# Patient Record
Sex: Male | Born: 1995
Health system: Southern US, Community
[De-identification: ages and names within clinical notes are randomized; demographics above are authoritative.]

## PROBLEM LIST (undated history)

## (undated) DIAGNOSIS — T189XXA Foreign body of alimentary tract, part unspecified, initial encounter: Secondary | ICD-10-CM

## (undated) DIAGNOSIS — F909 Attention-deficit hyperactivity disorder, unspecified type: Secondary | ICD-10-CM

## (undated) DIAGNOSIS — T182XXA Foreign body in stomach, initial encounter: Secondary | ICD-10-CM

## (undated) DIAGNOSIS — R636 Underweight: Secondary | ICD-10-CM

## (undated) DIAGNOSIS — F419 Anxiety disorder, unspecified: Secondary | ICD-10-CM

## (undated) HISTORY — PX: WISDOM TOOTH EXTRACTION: SHX21

## (undated) HISTORY — DX: Underweight: R63.6

## (undated) HISTORY — DX: Attention-deficit hyperactivity disorder, unspecified type: F90.9

## (undated) HISTORY — DX: Anxiety disorder, unspecified: F41.9

## (undated) HISTORY — DX: Foreign body of alimentary tract, part unspecified, initial encounter: T18.9XXA

## (undated) HISTORY — DX: Foreign body in stomach, initial encounter: T18.2XXA

---
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
## 2015-01-20 ENCOUNTER — Encounter: Payer: Self-pay | Admitting: Family Medicine

## 2015-01-20 ENCOUNTER — Ambulatory Visit (INDEPENDENT_AMBULATORY_CARE_PROVIDER_SITE_OTHER): Payer: BLUE CROSS/BLUE SHIELD | Admitting: Family Medicine

## 2015-01-20 VITALS — BP 98/61 | HR 66 | Temp 97.8°F | Ht 75.7 in | Wt 144.0 lb

## 2015-01-20 DIAGNOSIS — F988 Other specified behavioral and emotional disorders with onset usually occurring in childhood and adolescence: Secondary | ICD-10-CM

## 2015-01-20 DIAGNOSIS — F909 Attention-deficit hyperactivity disorder, unspecified type: Secondary | ICD-10-CM | POA: Diagnosis not present

## 2015-01-20 MED ORDER — METHYLPHENIDATE HCL 20 MG PO TABS: ORAL_TABLET | ORAL | Status: DC

## 2015-01-20 MED ORDER — METHYLPHENIDATE HCL ER (LA) 40 MG PO CP24: 40.0000 mg | ORAL_CAPSULE | ORAL | Status: DC

## 2015-01-20 MED ORDER — GUANFACINE HCL ER 3 MG PO TB24: 1.0000 | ORAL_TABLET | Freq: Every day | ORAL | Status: DC

## 2015-01-20 NOTE — Assessment & Plan Note (Signed)
Under good control on current regimen. Continue current regimen. Continue to monitor. 3 month supply given on his medicine. Recheck at physical in 3 months.

## 2015-01-20 NOTE — Progress Notes (Signed)
BP 98/61 mmHg  Pulse 66  Temp(Src) 97.8 F (36.6 C)  Ht 6' 3.7" (1.923 m)  Wt 144 lb (65.318 kg)  BMI 17.66 kg/m2  SpO2 98%   Subjective:    Patient ID: Kevin Townsend, male    DOB: [birth year], 19 y.o.   MRN: [id number]  HPI: Kevin Townsend is a 19 y.o. male  Chief Complaint  Patient presents with  . ADHD   ADHD FOLLOW UP ADHD status: controlled Satisfied with current therapy: yes Medication compliance:  excellent compliance Controlled substance contract: yes Previous psychiatry evaluation: no Previous medications: yes    Taking meds on weekends/vacations: occasionally Work/school performance:  good Difficulty sustaining attention/completing tasks: no Distracted by extraneous stimuli: no Does not listen when spoken to: no  Fidgets with hands or feet: no Unable to stay in seat: no Blurts out/interrupts others: no ADHD Medication Side Effects: no    Decreased appetite: yes    Headache: no    Sleeping disturbance pattern: no    Irritability: no    Rebound effects (worse than baseline) off medication: no    Anxiousness: no    Dizziness: no    Tics: no  Relevant past medical, surgical, family and social history reviewed and updated as indicated. Interim medical history since our last visit reviewed. Allergies and medications reviewed and updated.  Review of Systems  Constitutional: Negative.   Respiratory: Negative.   Cardiovascular: Negative.   Musculoskeletal: Negative.   Psychiatric/Behavioral: Negative.     Per HPI unless specifically indicated above     Objective:    BP 98/61 mmHg  Pulse 66  Temp(Src) 97.8 F (36.6 C)  Ht 6' 3.7" (1.923 m)  Wt 144 lb (65.318 kg)  BMI 17.66 kg/m2  SpO2 98%  Wt Readings from Last 3 Encounters:  01/20/15 144 lb (65.318 kg) (34 %*, Z = -0.41)  10/26/14 138 lb (62.596 kg) (26 %*, Z = -0.66)  07/26/14 134 lb 6.4 oz (60.963 kg) (21 %*, Z = -0.80)   * Growth percentiles are based on CDC 2-20 Years data.    Physical Exam   Constitutional: He is oriented to person, place, and time. He appears well-developed and well-nourished. No distress.  HENT:  Head: Normocephalic and atraumatic.  Right Ear: Hearing normal.  Left Ear: Hearing normal.  Nose: Nose normal.  Eyes: Conjunctivae and lids are normal. Right eye exhibits no discharge. Left eye exhibits no discharge. No scleral icterus.  Pulmonary/Chest: Effort normal. No respiratory distress.  Musculoskeletal: Normal range of motion.  Neurological: He is alert and oriented to person, place, and time.  Skin: Skin is intact. No rash noted.  Psychiatric: He has a normal mood and affect. His speech is normal and behavior is normal. Judgment and thought content normal. Cognition and memory are normal.    Results for orders placed or performed in visit on 08/10/14  CBC with Differential  Result Value Ref Range   WBC 4.7 3.4 - 10.8 x10E3/uL   RBC 4.93 4.14 - 5.80 x10E6/uL   Hemoglobin 12.8 12.6 - 17.7 g/dL   Hematocrit 86.5 78.4 - 51.0 %   MCV 79 79 - 97 fL   MCH 26.0 (L) 26.6 - 33.0 pg   MCHC 33.0 31.5 - 35.7 g/dL   RDW 69.6 (H) 29.5 - 28.4 %   Platelets 221 150 - 379 x10E3/uL   Neutrophils 56 %   Lymphs 35 %   Monocytes 9 %   Eos 0 %   Basos 0 %  Neutrophils Absolute 2.6 1.4 - 7.0 x10E3/uL   Lymphocytes Absolute 1.6 0.7 - 3.1 x10E3/uL   Monocytes Absolute 0.4 0.1 - 0.9 x10E3/uL   EOS (ABSOLUTE) 0.0 0.0 - 0.4 x10E3/uL   Basophils Absolute 0.0 0.0 - 0.2 x10E3/uL   Immature Granulocytes 0 %   Immature Grans (Abs) 0.0 0.0 - 0.1 x10E3/uL  Ferritin  Result Value Ref Range   Ferritin 12 (L) 16 - 124 ng/mL  B12  Result Value Ref Range   Vitamin B-12 572 211 - 946 pg/mL  Folate  Result Value Ref Range   Folate 7.6 >3.0 ng/mL  Iron and TIBC  Result Value Ref Range   Total Iron Binding Capacity 380 250 - 450 ug/dL   UIBC 865359 (H) 784111 - 696343 ug/dL   Iron 21 (L) 38 - 295169 ug/dL   Iron Saturation 6 (LL) 15 - 55 %      Assessment & Plan:   Problem List  Items Addressed This Visit      Other   ADD (attention deficit disorder) - Primary    Under good control on current regimen. Continue current regimen. Continue to monitor. 3 month supply given on his medicine. Recheck at physical in 3 months.           Follow up plan: Return in about 3 months (around 04/20/2015) for Physical.

## 2015-01-25 ENCOUNTER — Ambulatory Visit: Payer: BLUE CROSS/BLUE SHIELD | Admitting: Family Medicine

## 2015-04-20 ENCOUNTER — Ambulatory Visit: Payer: BLUE CROSS/BLUE SHIELD | Admitting: Family Medicine

## 2015-05-02 ENCOUNTER — Encounter: Payer: Self-pay | Admitting: Unknown Physician Specialty

## 2015-05-02 ENCOUNTER — Ambulatory Visit (INDEPENDENT_AMBULATORY_CARE_PROVIDER_SITE_OTHER): Payer: BLUE CROSS/BLUE SHIELD | Admitting: Unknown Physician Specialty

## 2015-05-02 VITALS — BP 109/64 | HR 71 | Temp 98.6°F | Ht 75.2 in | Wt 144.2 lb

## 2015-05-02 DIAGNOSIS — J029 Acute pharyngitis, unspecified: Secondary | ICD-10-CM

## 2015-05-02 DIAGNOSIS — J358 Other chronic diseases of tonsils and adenoids: Secondary | ICD-10-CM

## 2015-05-02 MED ORDER — AMOXICILLIN 875 MG PO TABS: 875.0000 mg | ORAL_TABLET | Freq: Two times a day (BID) | ORAL | Status: DC

## 2015-05-02 NOTE — Progress Notes (Signed)
BP 109/64 mmHg  Pulse 71  Temp(Src) 98.6 F (37 C)  Ht 6' 3.2" (1.91 m)  Wt 144 lb 3.2 oz (65.409 kg)  BMI 17.93 kg/m2  SpO2 98%   Subjective:    Patient ID: Kevin Townsend, male    DOB: 05/19/1995, 20 y.o.   MRN: [id number]  HPI: Kevin Townsend is a 20 y.o. male  Chief Complaint  Patient presents with  . Sore Throat    pt states he has had a sore throat for about a week, but has started hurting bad over the past few days   Sore Throat  This is a new problem. The current episode started in the past 7 days. The problem has been rapidly worsening. There has been no fever. The pain is severe. Associated symptoms include neck pain, swollen glands and trouble swallowing. Pertinent negatives include no abdominal pain, coughing or ear pain. He has tried nothing for the symptoms.     Relevant past medical, surgical, family and social history reviewed and updated as indicated. Interim medical history since our last visit reviewed. Allergies and medications reviewed and updated.  Review of Systems  HENT: Positive for trouble swallowing. Negative for ear pain.   Respiratory: Negative for cough.   Gastrointestinal: Negative for abdominal pain.  Musculoskeletal: Positive for neck pain.    Per HPI unless specifically indicated above     Objective:    BP 109/64 mmHg  Pulse 71  Temp(Src) 98.6 F (37 C)  Ht 6' 3.2" (1.91 m)  Wt 144 lb 3.2 oz (65.409 kg)  BMI 17.93 kg/m2  SpO2 98%  Wt Readings from Last 3 Encounters:  05/02/15 144 lb 3.2 oz (65.409 kg) (33 %*, Z = -0.44)  01/20/15 144 lb (65.318 kg) (34 %*, Z = -0.41)  10/26/14 138 lb (62.596 kg) (26 %*, Z = -0.66)   * Growth percentiles are based on CDC 2-20 Years data.    Physical Exam  Constitutional: He is oriented to person, place, and time. He appears well-developed and well-nourished. No distress.  HENT:  Head: Normocephalic and atraumatic.  Right Ear: Tympanic membrane and ear canal normal.  Left Ear: Tympanic  membrane and ear canal normal.  Nose: Right sinus exhibits no maxillary sinus tenderness and no frontal sinus tenderness. Left sinus exhibits no maxillary sinus tenderness and no frontal sinus tenderness.  Mouth/Throat: Uvula is midline. Oropharyngeal exudate, posterior oropharyngeal edema and posterior oropharyngeal erythema present.  Eyes: Conjunctivae and lids are normal. Right eye exhibits no discharge. Left eye exhibits no discharge. No scleral icterus.  Neck: Neck supple.  Cardiovascular: Normal rate, regular rhythm and normal heart sounds.   Pulmonary/Chest: Effort normal and breath sounds normal. No respiratory distress.  Abdominal: Normal appearance. There is no splenomegaly or hepatomegaly.  Musculoskeletal: Normal range of motion.  Neurological: He is alert and oriented to person, place, and time.  Skin: Skin is warm, dry and intact. No rash noted. No pallor.  Psychiatric: He has a normal mood and affect. His behavior is normal. Judgment and thought content normal.  Nursing note and vitals reviewed.   Results for orders placed or performed in visit on 08/10/14  CBC with Differential  Result Value Ref Range   WBC 4.7 3.4 - 10.8 x10E3/uL   RBC 4.93 4.14 - 5.80 x10E6/uL   Hemoglobin 12.8 12.6 - 17.7 g/dL   Hematocrit 91.438.8 78.237.5 - 51.0 %   MCV 79 79 - 97 fL   MCH 26.0 (L) 26.6 - 33.0 pg  MCHC 33.0 31.5 - 35.7 g/dL   RDW 16.1 (H) 09.6 - 04.5 %   Platelets 221 150 - 379 x10E3/uL   Neutrophils 56 %   Lymphs 35 %   Monocytes 9 %   Eos 0 %   Basos 0 %   Neutrophils Absolute 2.6 1.4 - 7.0 x10E3/uL   Lymphocytes Absolute 1.6 0.7 - 3.1 x10E3/uL   Monocytes Absolute 0.4 0.1 - 0.9 x10E3/uL   EOS (ABSOLUTE) 0.0 0.0 - 0.4 x10E3/uL   Basophils Absolute 0.0 0.0 - 0.2 x10E3/uL   Immature Granulocytes 0 %   Immature Grans (Abs) 0.0 0.0 - 0.1 x10E3/uL  Ferritin  Result Value Ref Range   Ferritin 12 (L) 16 - 124 ng/mL  B12  Result Value Ref Range   Vitamin B-12 572 211 - 946 pg/mL   Folate  Result Value Ref Range   Folate 7.6 >3.0 ng/mL  Iron and TIBC  Result Value Ref Range   Total Iron Binding Capacity 380 250 - 450 ug/dL   UIBC 409 (H) 811 - 914 ug/dL   Iron 21 (L) 38 - 782 ug/dL   Iron Saturation 6 (LL) 15 - 55 %      Assessment & Plan:   Problem List Items Addressed This Visit    None    Visit Diagnoses    Sore throat    -  Primary    Relevant Orders    Rapid strep screen (not at [facility])    Mononucleosis screen    Tonsillar exudate           rx for Amoxil.  Stop Mincycline whiile taking .  Check Mono spot  Follow up plan: Return if symptoms worsen or fail to improve.

## 2015-05-03 LAB — MONONUCLEOSIS SCREEN: MONO SCREEN: NEGATIVE

## 2015-05-04 LAB — RAPID STREP SCREEN (MED CTR MEBANE ONLY): Strep Gp A Ag, IA W/Reflex: NEGATIVE

## 2015-05-04 LAB — CULTURE, GROUP A STREP

## 2015-08-19 ENCOUNTER — Encounter: Payer: Self-pay | Admitting: Family Medicine

## 2015-08-19 ENCOUNTER — Ambulatory Visit (INDEPENDENT_AMBULATORY_CARE_PROVIDER_SITE_OTHER): Payer: BLUE CROSS/BLUE SHIELD | Admitting: Family Medicine

## 2015-08-19 VITALS — BP 105/69 | HR 83 | Temp 98.5°F | Ht 75.0 in | Wt 135.0 lb

## 2015-08-19 DIAGNOSIS — Z23 Encounter for immunization: Secondary | ICD-10-CM

## 2015-08-19 DIAGNOSIS — Z0000 Encounter for general adult medical examination without abnormal findings: Secondary | ICD-10-CM

## 2015-08-19 NOTE — Progress Notes (Signed)
BP 105/69 mmHg  Pulse 83  Temp(Src) 98.5 F (36.9 C)  Ht _0  (1.905 m)  Wt 135 lb (61.236 kg)  BMI 16.87 kg/m2  SpO2 98%   Subjective:    Patient ID: Kevin Townsend, male    DOB: 1995-12-04, 20 y.o.   MRN: [id number]  HPI: Kevin Townsend is a 20 y.o. male presenting on 08/19/2015 for comprehensive medical examination. 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  He currently lives with: with parents Interim Problems from his last visit: no  Depression Screen done today and results listed below:  Depression screen [facility] 2/9 08/19/2015  Decreased Interest 0  Down, Depressed, Hopeless 0  PHQ - 2 Score 0    Past Medical History:  Past Medical History  Diagnosis Date  . Underweight   . ADHD (attention deficit hyperactivity disorder)     Surgical History:  Past Surgical History  Procedure Laterality Date  . No past surgeries      Medications:  No current outpatient prescriptions on file prior to visit.   No current facility-administered medications on file prior to visit.    Allergies:  No Known Allergies  Social History:  Social History   Social History  . Marital Status: Single    Spouse Name: N/A  . Number of Children: 0  . Years of Education: N/A   Occupational History  . Student    Social History Main Topics  . Smoking status: Never Smoker   . Smokeless tobacco: Never Used  . Alcohol Use: No  . Drug Use: No  . Sexual Activity: Not on file   Other Topics Concern  . Not on file   Social History Narrative   History  Smoking status  . Never Smoker   Smokeless tobacco  . Never Used   History  Alcohol Use No    Family History:  History reviewed. No pertinent family history.  Past medical history, surgical history, medications, allergies, family history and social history reviewed with patient today and changes made to appropriate areas of the chart.   Review of Systems    Constitutional: Negative.   HENT: Negative.   Eyes: Negative.   Respiratory: Negative.   Cardiovascular: Negative.   Gastrointestinal: Negative.   Genitourinary: Negative.   Musculoskeletal: Negative.   Skin: Negative.   Neurological: Negative.   Endo/Heme/Allergies: Negative.   Psychiatric/Behavioral: Negative.     All other ROS negative except what is listed above and in the HPI.      Objective:    BP 105/69 mmHg  Pulse 83  Temp(Src) 98.5 F (36.9 C)  Ht _1  (1.905 m)  Wt 135 lb (61.236 kg)  BMI 16.87 kg/m2  SpO2 98%  Wt Readings from Last 3 Encounters:  08/19/15 135 lb (61.236 kg) (18 %*, Z = -0.93)  05/02/15 144 lb 3.2 oz (65.409 kg) (33 %*, Z = -0.44)  01/20/15 144 lb (65.318 kg) (34 %*, Z = -0.41)   * Growth percentiles are based on CDC 2-20 Years data.    Physical Exam  Constitutional: He is oriented to person, place, and time. He appears well-developed and well-nourished. No distress.  HENT:  Head: Normocephalic and atraumatic.  Right Ear: Hearing, tympanic membrane, external ear and ear canal normal.  Left Ear: Hearing, tympanic membrane, external ear and ear canal normal.  Nose: Nose normal.  Mouth/Throat: Uvula is midline, oropharynx is clear and moist and mucous membranes are normal.  No oropharyngeal exudate.  Eyes: Conjunctivae, EOM and lids are normal. Pupils are equal, round, and reactive to light. Right eye exhibits no discharge. Left eye exhibits no discharge. No scleral icterus.  Neck: Normal range of motion. Neck supple. No JVD present. No tracheal deviation present. No thyromegaly present.  Cardiovascular: Normal rate, regular rhythm, normal heart sounds and intact distal pulses.  Exam reveals no gallop and no friction rub.   No murmur heard. Pulmonary/Chest: Effort normal and breath sounds normal. No stridor. No respiratory distress. He has no wheezes. He has no rales. He exhibits no tenderness.  Abdominal: Soft. Bowel sounds are normal. He  exhibits no distension and no mass. There is no tenderness. There is no rebound and no guarding. Hernia confirmed negative in the right inguinal area and confirmed negative in the left inguinal area.  Genitourinary: Testes normal and penis normal. Right testis shows no mass, no swelling and no tenderness. Right testis is descended. Cremasteric reflex is not absent on the right side. Left testis shows no mass, no swelling and no tenderness. Left testis is descended. Cremasteric reflex is not absent on the left side. Circumcised. No penile tenderness.  Musculoskeletal: Normal range of motion. He exhibits no edema or tenderness.  Lymphadenopathy:    He has cervical adenopathy (R anterior chain).  Neurological: He is alert and oriented to person, place, and time. He has normal reflexes. He displays normal reflexes. No cranial nerve deficit. He exhibits normal muscle tone. Coordination normal.  Skin: Skin is warm, dry and intact. No rash noted. He is not diaphoretic. No erythema. No pallor.  Psychiatric: He has a normal mood and affect. His speech is normal and behavior is normal. Judgment and thought content normal. Cognition and memory are normal.  Nursing note and vitals reviewed.   Results for orders placed or performed in visit on 05/02/15  Rapid strep screen (not at [facility])  Result Value Ref Range   Strep Gp A Ag, IA W/Reflex Negative Negative  Culture, Group A Strep  Result Value Ref Range   Strep A Culture Comment (A)   Mononucleosis screen  Result Value Ref Range   Mono Screen Negative Negative      Assessment & Plan:   Problem List Items Addressed This Visit    None    Visit Diagnoses    Routine general medical examination at a health care facility    -  Primary    Vaccines for nursing school started to be updated. Hold on screening labs as low risk. Check next year. Continue diet and exercise.     Immunization due        Hep B #3 given today, Hep A #1 given today, return in 6  months for Hep A #2 and meningitis, will need 2nd MMR as well and varicella    Relevant Orders    Hepatitis A vaccine adult IM    Hepatitis B vaccine adult IM    Hepatitis A vaccine adult IM    Meningococcal polysaccharide vaccine subcutaneous       IMMUNIZATIONS:   - Tdap: Tetanus vaccination status reviewed: last tetanus booster within 10 years. - Influenza: Postponed to flu season  PATIENT COUNSELING:    Sexuality: Discussed sexually transmitted diseases, partner selection, use of condoms, avoidance of unintended pregnancy  and contraceptive alternatives.   Advised to avoid cigarette smoking.  I discussed with the patient that most people either abstain from alcohol or drink within safe limits (<=14/week and <=4 drinks/occasion for  males, <=7/weeks and <= 3 drinks/occasion for females) and that the risk for alcohol disorders and other health effects rises proportionally with the number of drinks per week and how often a drinker exceeds daily limits.  Discussed cessation/primary prevention of drug use and availability of treatment for abuse.   Diet: Encouraged to adjust caloric intake to maintain  or achieve ideal body weight, to reduce intake of dietary saturated fat and total fat, to limit sodium intake by avoiding high sodium foods and not adding table salt, and to maintain adequate dietary potassium and calcium preferably from fresh fruits, vegetables, and low-fat dairy products.    stressed the importance of regular exercise  Injury prevention: Discussed safety belts, safety helmets, smoke detector, smoking near bedding or upholstery.   Dental health: Discussed importance of regular tooth brushing, flossing, and dental visits.   Follow up plan: NEXT PREVENTATIVE PHYSICAL DUE IN 1 YEAR. Return 6 months, nurse visit for shots, 1 year physical.

## 2015-08-19 NOTE — Patient Instructions (Signed)
Health Maintenance, Male A healthy lifestyle and preventative care can promote health and wellness.  Maintain regular health, dental, and eye exams.  Eat a healthy diet. Foods like vegetables, fruits, whole grains, low-fat dairy products, and lean protein foods contain the nutrients you need and are low in calories. Decrease your intake of foods high in solid fats, added sugars, and salt. Get information about a proper diet from your health care provider, if necessary.  Regular physical exercise is one of the most important things you can do for your health. Most adults should get at least 150 minutes of moderate-intensity exercise (any activity that increases your heart rate and causes you to sweat) each week. In addition, most adults need muscle-strengthening exercises on 2 or more days a week.   Maintain a healthy weight. The body mass index (BMI) is a screening tool to identify possible weight problems. It provides an estimate of body fat based on height and weight. Your health care provider can find your BMI and can help you achieve or maintain a healthy weight. For males 20 years and older:  A BMI below 18.5 is considered underweight.  A BMI of 18.5 to 24.9 is normal.  A BMI of 25 to 29.9 is considered overweight.  A BMI of 30 and above is considered obese.  Maintain normal blood lipids and cholesterol by exercising and minimizing your intake of saturated fat. Eat a balanced diet with plenty of fruits and vegetables. Blood tests for lipids and cholesterol should begin at age 20 and be repeated every 5 years. If your lipid or cholesterol levels are high, you are over age 50, or you are at high risk for heart disease, you may need your cholesterol levels checked more frequently.Ongoing high lipid and cholesterol levels should be treated with medicines if diet and exercise are not working.  If you smoke, find out from your health care provider how to quit. If you do not use tobacco, do not  start.  Lung cancer screening is recommended for adults aged 55-80 years who are at high risk for developing lung cancer because of a history of smoking. A yearly low-dose CT scan of the lungs is recommended for people who have at least a 30-pack-year history of smoking and are current smokers or have quit within the past 15 years. A pack year of smoking is smoking an average of 1 pack of cigarettes a day for 1 year (for example, a 30-pack-year history of smoking could mean smoking 1 pack a day for 30 years or 2 packs a day for 15 years). Yearly screening should continue until the smoker has stopped smoking for at least 15 years. Yearly screening should be stopped for people who develop a health problem that would prevent them from having lung cancer treatment.  If you choose to drink alcohol, do not have more than 2 drinks per day. One drink is considered to be 12 oz (360 mL) of beer, 5 oz (150 mL) of wine, or 1.5 oz (45 mL) of liquor.  Avoid the use of street drugs. Do not share needles with anyone. Ask for help if you need support or instructions about stopping the use of drugs.  High blood pressure causes heart disease and increases the risk of stroke. High blood pressure is more likely to develop in:  People who have blood pressure in the end of the normal range (100-139/85-89 mm Hg).  People who are overweight or obese.  People who are African American.    If you are 18-39 years of age, have your blood pressure checked every 3-5 years. If you are 40 years of age or older, have your blood pressure checked every year. You should have your blood pressure measured twice--once when you are at a hospital or clinic, and once when you are not at a hospital or clinic. Record the average of the two measurements. To check your blood pressure when you are not at a hospital or clinic, you can use:  An automated blood pressure machine at a pharmacy.  A home blood pressure monitor.  If you are 45-79 years  old, ask your health care provider if you should take aspirin to prevent heart disease.  Diabetes screening involves taking a blood sample to check your fasting blood sugar level. This should be done once every 3 years after age 45 if you are at a normal weight and without risk factors for diabetes. Testing should be considered at a younger age or be carried out more frequently if you are overweight and have at least 1 risk factor for diabetes.  Colorectal cancer can be detected and often prevented. Most routine colorectal cancer screening begins at the age of 50 and continues through age 75. However, your health care provider may recommend screening at an earlier age if you have risk factors for colon cancer. On a yearly basis, your health care provider may provide home test kits to check for hidden blood in the stool. A small camera at the end of a tube may be used to directly examine the colon (sigmoidoscopy or colonoscopy) to detect the earliest forms of colorectal cancer. Talk to your health care provider about this at age 50 when routine screening begins. A direct exam of the colon should be repeated every 5-10 years through age 75, unless early forms of precancerous polyps or small growths are found.  People who are at an increased risk for hepatitis B should be screened for this virus. You are considered at high risk for hepatitis B if:  You were born in a country where hepatitis B occurs often. Talk with your health care provider about which countries are considered high risk.  Your parents were born in a high-risk country and you have not received a shot to protect against hepatitis B (hepatitis B vaccine).  You have HIV or AIDS.  You use needles to inject street drugs.  You live with, or have sex with, someone who has hepatitis B.  You are a man who has sex with other men (MSM).  You get hemodialysis treatment.  You take certain medicines for conditions like cancer, organ  transplantation, and autoimmune conditions.  Hepatitis C blood testing is recommended for all people born from 1945 through 1965 and any individual with known risk factors for hepatitis C.  Healthy men should no longer receive prostate-specific antigen (PSA) blood tests as part of routine cancer screening. Talk to your health care provider about prostate cancer screening.  Testicular cancer screening is not recommended for adolescents or adult males who have no symptoms. Screening includes self-exam, a health care provider exam, and other screening tests. Consult with your health care provider about any symptoms you have or any concerns you have about testicular cancer.  Practice safe sex. Use condoms and avoid high-risk sexual practices to reduce the spread of sexually transmitted infections (STIs).  You should be screened for STIs, including gonorrhea and chlamydia if:  You are sexually active and are younger than 24 years.  You   are older than 24 years, and your health care provider tells you that you are at risk for this type of infection.  Your sexual activity has changed since you were last screened, and you are at an increased risk for chlamydia or gonorrhea. Ask your health care provider if you are at risk.  If you are at risk of being infected with HIV, it is recommended that you take a prescription medicine daily to prevent HIV infection. This is called pre-exposure prophylaxis (PrEP). You are considered at risk if:  You are a man who has sex with other men (MSM).  You are a heterosexual man who is sexually active with multiple partners.  You take drugs by injection.  You are sexually active with a partner who has HIV.  Talk with your health care provider about whether you are at high risk of being infected with HIV. If you choose to begin PrEP, you should first be tested for HIV. You should then be tested every 3 months for as long as you are taking PrEP.  Use sunscreen. Apply  sunscreen liberally and repeatedly throughout the day. You should seek shade when your shadow is shorter than you. Protect yourself by wearing long sleeves, pants, a wide-brimmed hat, and sunglasses year round whenever you are outdoors.  Tell your health care provider of new moles or changes in moles, especially if there is a change in shape or color. Also, tell your health care provider if a mole is larger than the size of a pencil eraser.  A one-time screening for abdominal aortic aneurysm (AAA) and surgical repair of large AAAs by ultrasound is recommended for men aged 65-75 years who are current or former smokers.  Stay current with your vaccines (immunizations).   This information is not intended to replace advice given to you by your health care provider. Make sure you discuss any questions you have with your health care provider.   Document Released: 07/21/2007 Document Revised: 02/12/2014 Document Reviewed: 06/19/2010 Elsevier Interactive Patient Education 2016 Else[employer] Inc[employer] Hepatitis B Vaccine: What You Need to Know 1. Why get vaccinated? Hepatitis B is a serious disease that affects the liver. It is caused by the hepatitis B virus. Hepatitis B can cause mild illness lasting a few weeks, or it can lead to a serious, lifelong illness. Hepatitis B virus infection can be either acute or chronic. Acute hepatitis B virus infection is a short-term illness that occurs within the first 6 months after someone is exposed to the hepatitis B virus. This can lead to:  fever, fatigue, loss of appetite, nausea, and/or vomiting  jaundice (yellow skin or eyes, dark urine, clay-colored bowel movements)  pain in muscles, joints, and stomach Chronic hepatitis B virus infection is a long-term illness that occurs when the hepatitis B virus remains in a person's body. Most people who go on to develop chronic hepatitis B do not have symptoms, but it is still very serious and can lead to:  liver damage  (cirrhosis)  liver cancer  death Chronically-infected people can spread hepatitis B virus to others, even if they do not feel or look sick themselves. Up to 1.4 million people in the [place] may have chronic hepatitis B infection. About 90% of infants who get hepatitis B become chronically infected and about 1 out of 4 of them dies. Hepatitis B is spread when blood, semen, or other body fluid infected with the Hepatitis B virus enters the body of a person who is not infected.  People can become infected with the virus through:  Birth (a baby whose mother is infected can be infected at or after birth)  Sharing items such as razors or toothbrushes with an infected person  Contact with the blood or open sores of an infected person  Sex with an infected partner  Sharing needles, syringes, or other drug-injection equipment  Exposure to blood from needlesticks or other sharp instruments Each year about 2,000 people in the [place] die from hepatitis B-related liver disease. Hepatitis B vaccine can prevent hepatitis B and its consequences, including liver cancer and cirrhosis. 2. Hepatitis B vaccine Hepatitis B vaccine is made from parts of the hepatitis B virus. It cannot cause hepatitis B infection. The vaccine is usually given as 3 or 4 shots over a 62-month period. Infants should get their first dose of hepatitis B vaccine at birth and will usually complete the series at 28 months of age. All children and adolescents younger than 19 years of age who have not yet gotten the vaccine should also be vaccinated. Hepatitis B vaccine is recommended for unvaccinated adults who are at risk for hepatitis B virus infection, including:  People whose sex partners have hepatitis B  Sexually active persons who are not in a long-term monogamous relationship  Persons seeking evaluation or treatment for a sexually transmitted disease  Men who have sexual contact with other men  People who share  needles, syringes, or other drug-injection equipment  People who have household contact with someone infected with the hepatitis B virus  Health care and public safety workers at risk for exposure to blood or body fluids  Residents and staff of facilities for developmentally disabled persons  Persons in correctional facilities  Victims of sexual assault or abuse  Travelers to regions with increased rates of hepatitis B  People with chronic liver disease, kidney disease, HIV infection, or diabetes  Anyone who wants to be protected from hepatitis B There are no known risks to getting hepatitis B vaccine at the same time as other vaccines. 3. Some people should not get this vaccine Tell the person who is giving the vaccine:  If the person getting the vaccine has any severe, life-threatening allergies. If you ever had a life-threatening allergic reaction after a dose of hepatitis B vaccine, or have a severe allergy to any part of this vaccine, you may be advised not to get vaccinated. Ask your health care provider if you want information about vaccine components.  If the person getting the vaccine is not feeling well. If you have a mild illness, such as a cold, you can probably get the vaccine today. If you are moderately or severely ill, you should probably wait until you recover. Your doctor can advise you. 4. Risks of a vaccine reaction With any medicine, including vaccines, there is a chance of side effects. These are usually mild and go away on their own, but serious reactions are also possible. Most people who get hepatitis B vaccine do not have any problems with it. Minor problems following hepatitis B vaccine include:  soreness where the shot was given  temperature of 99.27F or higher If these problems occur, they usually begin soon after the shot and last 1 or 2 days. Your doctor can tell you more about these reactions. Other problems that could happen after this  vaccine:  People sometimes faint after a medical procedure, including vaccination. Sitting or lying down for about 15 minutes can help prevent fainting and injuries caused  by a fall. Tell your provider if you feel dizzy, or have vision changes or ringing in the ears.  Some people get shoulder pain that can be more severe and longer-lasting than the more routine soreness that can follow injections. This happens very rarely.  Any medication can cause a severe allergic reaction. Such reactions from a vaccine are very rare, estimated at about 1 in a million doses, and would happen within a few minutes to a few hours after the vaccination. As with any medicine, there is a very remote chance of a vaccine causing a serious injury or death. The safety of vaccines is always being monitored. For more information, visit: http://floyd.org/ 5. What if there is a serious problem? What should I look for?  Look for anything that concerns you, such as signs of a severe allergic reaction, very high fever, or unusual behavior. Signs of a severe allergic reaction can include hives, swelling of the face and throat, difficulty breathing, a fast heartbeat, dizziness, and weakness. These would start a few minutes to a few hours after the vaccination. What should I do?  If you think it is a severe allergic reaction or other emergency that can't wait, call 9-1-1 or get to the nearest hospital. Otherwise, call your clinic. Afterward, the reaction should be reported to the Vaccine Adverse Event Reporting System (VAERS). Your doctor should file this report, or you can do it yourself through the VAERS web site at www.vaers.LAgents.no, or by calling 1-(623)447-7539. VAERS does not give medical advice. 6. The National Vaccine Injury Compensation Program The [employer] Vaccine Injury Compensation Program (VICP) is a federal program that was created to compensate people who may have been injured by certain vaccines. Persons  who believe they may have been injured by a vaccine can learn about the program and about filing a claim by calling 1-(213)527-5015 or visiting the VICP website at SpiritualWord.at. There is a time limit to file a claim for compensation. 7. How can I learn more?  Ask your healthcare provider. He or she can give you the vaccine package insert or suggest other sources of information.  Call your local or state health department.  Contact the Centers for Disease Control and Prevention (CDC):  Call 319-817-5695 (1-800-CDC-INFO) or  Visit CDC's website at PicCapture.uy CDC Hepatitis B VIS (08/25/2014)   This information is not intended to replace advice given to you by your health care provider. Make sure you discuss any questions you have with your health care provider.   Document Released: 11/16/2005 Document Revised: 10/13/2014 Document Reviewed: 09/11/2014 Elsevier Interactive Patient Education 2016 Elsevier Inc. Hepatitis A Vaccine: What You Need to Know 1. Why get vaccinated? Hepatitis A is a serious liver disease. It is caused by the hepatitis A virus (HAV). HAV is spread from person to person through contact with the feces (stool) of people who are infected, which can easily happen if someone does not wash his or her hands properly. You can also get hepatitis A from food, water, or objects contaminated with HAV. Symptoms of hepatitis A can include:  fever, fatigue, loss of appetite, nausea, vomiting, and/or joint pain  severe stomach pains and diarrhea (mainly in children), or  jaundice (yellow skin or eyes, dark urine, clay-colored bowel movements). These symptoms usually appear 2 to 6 weeks after exposure and usually last less than 2 months, although some people can be ill for as long as 6 months. If you have hepatitis A you may be too ill to work. Children  often do not have symptoms, but most adults do. You can spread HAV without having symptoms. Hepatitis A  can cause liver failure and death, although this is rare and occurs more commonly in persons 20 years of age or older and persons with other liver diseases, such as hepatitis B or C. Hepatitis A vaccine can prevent hepatitis A. Hepatitis A vaccines were recommended in the [place] beginning in 1996. Since then, the number of cases reported each year in the [place] has dropped from around 31,000 cases to fewer than 1,500 cases. 2. Hepatitis A vaccine Hepatitis A vaccine is an inactivated (killed) vaccine. You will need 2 doses for long-lasting protection. These doses should be given at least 6 months apart. Children are routinely vaccinated between their first and second birthdays (7412 through 3823 months of age). Older children and adolescents can get the vaccine after 23 months. Adults who have not been vaccinated previously and want to be protected against hepatitis A can also get the vaccine. You should get hepatitis A vaccine if you:  are traveling to countries where hepatitis A is common,  are a man who has sex with other men,  use illegal drugs,  have a chronic liver disease such as hepatitis B or hepatitis C,  are being treated with clotting-factor concentrates,  work with hepatitis A-infected animals or in a hepatitis A research laboratory, or  expect to have close personal contact with an international adoptee from a country where hepatitis A is common Ask your healthcare provider if you want more information about any of these groups. There are no known risks to getting hepatitis A vaccine at the same time as other vaccines. 3. Some people should not get this vaccine Tell the person who is giving you the vaccine:  If you have any severe, life-threatening allergies. If you ever had a life-threatening allergic reaction after a dose of hepatitis A vaccine, or have a severe allergy to any part of this vaccine, you may be advised not to get vaccinated. Ask your health care provider if  you want information about vaccine components.  If you are not feeling well. If you have a mild illness, such as a cold, you can probably get the vaccine today. If you are moderately or severely ill, you should probably wait until you recover. Your doctor can advise you. 4. Risks of a vaccine reaction With any medicine, including vaccines, there is a chance of side effects. These are usually mild and go away on their own, but serious reactions are also possible. Most people who get hepatitis A vaccine do not have any problems with it. Minor problems following hepatitis A vaccine include:  soreness or redness where the shot was given  low-grade fever  headache  tiredness If these problems occur, they usually begin soon after the shot and last 1 or 2 days. Your doctor can tell you more about these reactions. Other problems that could happen after this vaccine:  People sometimes faint after a medical procedure, including vaccination. Sitting or lying down for about 15 minutes can help prevent fainting, and injuries caused by a fall. Tell your provider if you feel dizzy, or have vision changes or ringing in the ears.  Some people get shoulder pain that can be more severe and longer lasting than the more routine soreness that can follow injections. This happens very rarely.  Any medication can cause a severe allergic reaction. Such reactions from a vaccine are very rare, estimated at about 1  in a million doses, and would happen within a few minutes to a few hours after the vaccination. As with any medicine, there is a very remote chance of a vaccine causing a serious injury or death. The safety of vaccines is always being monitored. For more information, visit: http://floyd.org/ 5. What if there is a serious problem? What should I look for?  Look for anything that concerns you, such as signs of a severe allergic reaction, very high fever, or unusual behavior. Signs of a severe  allergic reaction can include hives, swelling of the face and throat, difficulty breathing, a fast heartbeat, dizziness, and weakness. These would start a few minutes to a few hours after the vaccination. What should I do?  If you think it is a severe allergic reaction or other emergency that can't wait, call 9-1-1 or get to the nearest hospital. Otherwise, call your clinic. Afterward, the reaction should be reported to the Vaccine Adverse Event Reporting System (VAERS). Your doctor should file this report, or you can do it yourself through the VAERS web site at www.vaers.LAgents.no, or by calling 1-325-351-6807. VAERS does not give medical advice. 6. The National Vaccine Injury Compensation Program The [employer] Vaccine Injury Compensation Program (VICP) is a federal program that was created to compensate people who may have been injured by certain vaccines. Persons who believe they may have been injured by a vaccine can learn about the program and about filing a claim by calling 1-878-092-8579 or visiting the VICP website at SpiritualWord.at. There is a time limit to file a claim for compensation. 7. How can I learn more?  Ask your healthcare provider. He or she can give you the vaccine package insert or suggest other sources of information.  Call your local or state health department.  Contact the Centers for Disease Control and Prevention (CDC):  Call (563)546-1765 (1-800-CDC-INFO) or  Visit CDC's website at PicCapture.uy CDC Hepatitis A Vaccine VIS (08/25/2014)   This information is not intended to replace advice given to you by your health care provider. Make sure you discuss any questions you have with your health care provider.   Document Released: 11/16/2005 Document Revised: 10/13/2014 Document Reviewed: 09/11/2014 Elsevier Interactive Patient Education [employer]

## 2015-08-29 ENCOUNTER — Telehealth: Payer: Self-pay | Admitting: Family Medicine

## 2015-08-29 NOTE — Telephone Encounter (Signed)
Patients father not on [facility]

## 2015-08-29 NOTE — Telephone Encounter (Signed)
Pt's father called stated he is following up on pt's appt from the other week. Please call pt's dad ASAP. Thanks.

## 2015-08-29 NOTE — Telephone Encounter (Signed)
Pt's father is calling regarding his ADHD medication, also wants to inform that pt saw a psychiatrist and they gave him a RX for Vyvanse. Please follow up with pt's father ASAP. Thanks.

## 2015-08-29 NOTE — Telephone Encounter (Signed)
Called pt's father to inform, no answer, left message to return call. Thanks.

## 2015-08-29 NOTE — Telephone Encounter (Signed)
Stephanie: Please call patient's father and let him know that unfortunately he is not on patient's DPR so we can not discuss anything with him.

## 2015-09-06 ENCOUNTER — Telehealth: Payer: Self-pay | Admitting: Family Medicine

## 2015-09-06 NOTE — Telephone Encounter (Signed)
Pt's father called would like a call back from Dr. Laural Benes regarding pt's medication. Dad is listed on pt's DPR okay to discuss medical information. Thanks.

## 2015-09-06 NOTE — Telephone Encounter (Signed)
[employer]. Weston Brass saw psychiatry for his ADD and had his medication changed. Not going to continue to see psychiatry and would like [place] to continue his medication. Will come up to get records release for psychiatry and then will get him his 3 month supply of his medicine once we have the records.

## 2015-10-21 ENCOUNTER — Telehealth: Payer: Self-pay | Admitting: Family Medicine

## 2015-10-21 NOTE — Telephone Encounter (Signed)
They are under the media tab when you go into the patient's chart. Thanks

## 2015-10-21 NOTE — Telephone Encounter (Signed)
Spoke with patients father, he wanted to discuss the records from neuropsych and get you to start the patient on I believe Vyvanse and Ritalin, over the phone.  Patients father does not understand why his ADHD was not addressed at his CPE in July, I informed him that his son should have signed a paper that explained with was covered at a physical. Patient's father was told that he would have to come in every 3 months to have his medication written, he states that he has always taken his children to the doctor twice a year once for a CPE/ADHD and [facility]. I explained that you require every three months, he states that we are just trying to make money off of them and that this was not in his budget for him to come back to the doctor again. They he states that he is not going to win with me so he would just call back to schedule an appointment.

## 2015-10-21 NOTE — Telephone Encounter (Signed)
Can you please find out what he needs? Thanks!

## 2015-10-21 NOTE — Telephone Encounter (Signed)
Pt's dad called would like a call back from Dr. Laural BenesJohnson. Records have been received and have been scanned into the pt's chart. Thanks.

## 2015-10-21 NOTE — Telephone Encounter (Signed)
Noted. Thank you. Forwarded to [place] [employer].

## 2015-10-21 NOTE — Telephone Encounter (Signed)
Where do you see the records at in the patients chart?

## 2015-10-27 ENCOUNTER — Ambulatory Visit (INDEPENDENT_AMBULATORY_CARE_PROVIDER_SITE_OTHER): Payer: BLUE CROSS/BLUE SHIELD | Admitting: Family Medicine

## 2015-10-27 ENCOUNTER — Encounter: Payer: Self-pay | Admitting: Family Medicine

## 2015-10-27 DIAGNOSIS — F909 Attention-deficit hyperactivity disorder, unspecified type: Secondary | ICD-10-CM

## 2015-10-27 DIAGNOSIS — F988 Other specified behavioral and emotional disorders with onset usually occurring in childhood and adolescence: Secondary | ICD-10-CM

## 2015-10-27 MED ORDER — AMPHETAMINE-DEXTROAMPHETAMINE 10 MG PO TABS: 10.0000 mg | ORAL_TABLET | Freq: Every day | ORAL | 0 refills | Status: DC

## 2015-10-27 MED ORDER — LISDEXAMFETAMINE DIMESYLATE 30 MG PO CAPS: 30.0000 mg | ORAL_CAPSULE | Freq: Every day | ORAL | 0 refills | Status: DC

## 2015-10-27 MED ORDER — CYPROHEPTADINE HCL 4 MG PO TABS: 4.0000 mg | ORAL_TABLET | Freq: Every day | ORAL | 12 refills | Status: DC | PRN

## 2015-10-27 NOTE — Progress Notes (Signed)
BP 105/70 (BP Location: Right Arm, Patient Position: Sitting, Cuff Size: Normal)   Pulse 98   Temp 99.4 F (37.4 C)   Ht 6' 3.5" (1.918 m)   Wt 136 lb (61.7 kg)   SpO2 97%   BMI 16.77 kg/m    Subjective:    Patient ID: Kevin Townsend, male    DOB: 1995-10-01, 20 y.o.   MRN: [id number]  HPI: Kevin Townsend is a 20 y.o. male  Chief Complaint  Patient presents with  . ADHD    follow up   ADHD FOLLOW UP- saw neuropsych in February, started on vyvanse. Has not been back to see them, so has been off his meds. Would like to get restarted on his meds from them. Thought that it was working well.  50mg  was too much. Still has some of his short acting ritalin, so taking it on short days. Doesn't like how it makes him feel (anxious with bad rebound) Would like to try adderall and see if it works better.  ADHD status: better Satisfied with current therapy: no Medication compliance:  excellent compliance Controlled substance contract: yes Previous psychiatry evaluation: yes Previous medications: yes focalin (dexamethylphenidate)". "ritalin, ritalin LA, ritalin SR and vyvanse (lisdexamfethamine)   Taking meds on weekends/vacations: occasionally Work/school performance:  excellent Difficulty sustaining attention/completing tasks: no Distracted by extraneous stimuli: no Does not listen when spoken to: no  Fidgets with hands or feet: no Unable to stay in seat: no Blurts out/interrupts others: no ADHD Medication Side Effects: yes    Decreased appetite: no    Headache: no    Sleeping disturbance pattern: no    Irritability: no    Rebound effects (worse than baseline) off medication: yes- only on the ritalin    Anxiousness: yes- on the ritalin    Dizziness: no    Tics: no   Relevant past medical, surgical, family and social history reviewed and updated as indicated. Interim medical history since our last visit reviewed. Allergies and medications reviewed and updated.  Review of Systems    Constitutional: Negative.   Respiratory: Negative.   Cardiovascular: Negative.   Psychiatric/Behavioral: Positive for decreased concentration. Negative for agitation, behavioral problems, confusion, dysphoric mood, hallucinations, self-injury, sleep disturbance and suicidal ideas. The patient is nervous/anxious. The patient is not hyperactive.     Per HPI unless specifically indicated above     Objective:    BP 105/70 (BP Location: Right Arm, Patient Position: Sitting, Cuff Size: Normal)   Pulse 98   Temp 99.4 F (37.4 C)   Ht 6' 3.5" (1.918 m)   Wt 136 lb (61.7 kg)   SpO2 97%   BMI 16.77 kg/m   Wt Readings from Last 3 Encounters:  10/27/15 136 lb (61.7 kg)  08/19/15 135 lb (61.2 kg) (18 %, Z= -0.93)*  05/02/15 144 lb 3.2 oz (65.4 kg) (33 %, Z= -0.44)*   * Growth percentiles are based on CDC 2-20 Years data.    Physical Exam  Constitutional: He is oriented to person, place, and time. He appears well-developed and well-nourished. No distress.  HENT:  Head: Normocephalic and atraumatic.  Right Ear: Hearing normal.  Left Ear: Hearing normal.  Nose: Nose normal.  Eyes: Conjunctivae and lids are normal. Right eye exhibits no discharge. Left eye exhibits no discharge. No scleral icterus.  Cardiovascular: Normal rate, regular rhythm, normal heart sounds and intact distal pulses.  Exam reveals no gallop and no friction rub.   No murmur heard. Pulmonary/Chest: Effort normal and  breath sounds normal. No respiratory distress. He has no wheezes. He has no rales. He exhibits no tenderness.  Musculoskeletal: Normal range of motion.  Neurological: He is alert and oriented to person, place, and time.  Skin: Skin is warm, dry and intact. No rash noted. No erythema. No pallor.  Psychiatric: He has a normal mood and affect. His speech is normal and behavior is normal. Judgment and thought content normal. Cognition and memory are normal.  Vitals reviewed.   Results for orders placed or  performed in visit on 05/02/15  [facility] ([facility])  Result Value Ref Range   Strep Gp A Ag, IA W/Reflex Negative Negative  Culture, Group A Strep  Result Value Ref Range   Strep A Culture Comment (A)   Mononucleosis screen  Result Value Ref Range   Mono Screen Negative Negative      Assessment & Plan:   Problem List Items Addressed This Visit      Other   ADD (attention deficit disorder)    Does not want to take his vyvanse every day. Will start him on adderall due to the side effects he is having from ritalin. Refills of both of these and his periactin given today. Scripts should last about 2 months. Will check in in about 4-6 weeks to see how meds are working and adjust as needed. If doing well, will give 3 month supplies of medicines. PMP reviewed today and appropriate.        Other Visit Diagnoses   None.      Follow up plan: Return 4-6 weeks, for Recheck on meds.

## 2015-10-27 NOTE — Assessment & Plan Note (Signed)
Does not want to take his vyvanse every day. Will start him on adderall due to the side effects he is having from ritalin. Refills of both of these and his periactin given today. Scripts should last about 2 months. Will check in in about 4-6 weeks to see how meds are working and adjust as needed. If doing well, will give 3 month supplies of medicines. PMP reviewed today and appropriate.

## 2015-11-29 ENCOUNTER — Ambulatory Visit: Payer: BLUE CROSS/BLUE SHIELD | Admitting: Family Medicine

## 2015-12-05 ENCOUNTER — Ambulatory Visit (INDEPENDENT_AMBULATORY_CARE_PROVIDER_SITE_OTHER): Payer: BLUE CROSS/BLUE SHIELD | Admitting: Family Medicine

## 2015-12-05 ENCOUNTER — Encounter: Payer: Self-pay | Admitting: Family Medicine

## 2015-12-05 VITALS — BP 114/73 | HR 77 | Temp 98.0°F | Wt 137.2 lb

## 2015-12-05 DIAGNOSIS — F988 Other specified behavioral and emotional disorders with onset usually occurring in childhood and adolescence: Secondary | ICD-10-CM | POA: Diagnosis not present

## 2015-12-05 DIAGNOSIS — Z23 Encounter for immunization: Secondary | ICD-10-CM

## 2015-12-05 MED ORDER — AMPHETAMINE-DEXTROAMPHETAMINE 20 MG PO TABS: 20.0000 mg | ORAL_TABLET | Freq: Every day | ORAL | 0 refills | Status: DC

## 2015-12-05 NOTE — Patient Instructions (Signed)

## 2015-12-05 NOTE — Assessment & Plan Note (Signed)
Will stop his vyvanse and increase his adderall to 20mg  daily. Recheck 3 months. 3 month supply given today. Call with any concerns.

## 2015-12-05 NOTE — Progress Notes (Signed)
BP 114/73 (BP Location: Left Arm, Patient Position: Sitting, Cuff Size: Normal)   Pulse 77   Temp 98 F (36.7 C)   Wt 137 lb 3.2 oz (62.2 kg)   SpO2 98%   BMI 16.92 kg/m    Subjective:    Patient ID: Kevin Townsend, male    DOB: 12/11/1995, 20 y.o.   MRN: [id number]  HPI: Kevin Brillicholas Bodey is a 20 y.o. male  Chief Complaint  Patient presents with  . ADHD   ADHD FOLLOW UP- not taking the vyvanse any more. Doing well on the adderall, but needs to take the 20mg . Tolerating it well ADHD status: controlled Satisfied with current therapy: yes Medication compliance:  good compliance Controlled substance contract: yes Previous psychiatry evaluation: yes Previous medications: yes adderall, adderall XR, concerta, daytrana (methylphenidate), focalin (dexamethylphenidate)". "ritalin, ritalin LA, ritalin SR and vyvanse (lisdexamfethamine)   Taking meds on weekends/vacations: occasionally Work/school performance:  good Difficulty sustaining attention/completing tasks: no Distracted by extraneous stimuli: no Does not listen when spoken to: no  Fidgets with hands or feet: no Unable to stay in seat: no Blurts out/interrupts others: no ADHD Medication Side Effects: no    Decreased appetite: no    Headache: no    Sleeping disturbance pattern: no    Irritability: no    Rebound effects (worse than baseline) off medication: no    Anxiousness: no    Dizziness: no    Tics: no  Relevant past medical, surgical, family and social history reviewed and updated as indicated. Interim medical history since our last visit reviewed. Allergies and medications reviewed and updated.  Review of Systems  Constitutional: Negative.   Respiratory: Negative.   Cardiovascular: Negative.   Psychiatric/Behavioral: Negative.     Per HPI unless specifically indicated above     Objective:    BP 114/73 (BP Location: Left Arm, Patient Position: Sitting, Cuff Size: Normal)   Pulse 77   Temp 98 F (36.7 C)    Wt 137 lb 3.2 oz (62.2 kg)   SpO2 98%   BMI 16.92 kg/m   Wt Readings from Last 3 Encounters:  12/05/15 137 lb 3.2 oz (62.2 kg)  10/27/15 136 lb (61.7 kg)  08/19/15 135 lb (61.2 kg) (18 %, Z= -0.93)*   * Growth percentiles are based on CDC 2-20 Years data.    Physical Exam  Constitutional: He is oriented to person, place, and time. He appears well-developed and well-nourished. No distress.  HENT:  Head: Normocephalic and atraumatic.  Right Ear: Hearing normal.  Left Ear: Hearing normal.  Nose: Nose normal.  Eyes: Conjunctivae and lids are normal. Right eye exhibits no discharge. Left eye exhibits no discharge. No scleral icterus.  Cardiovascular: Normal rate, regular rhythm, normal heart sounds and intact distal pulses.  Exam reveals no gallop and no friction rub.   No murmur heard. Pulmonary/Chest: Effort normal and breath sounds normal. No respiratory distress. He has no wheezes. He has no rales. He exhibits no tenderness.  Musculoskeletal: Normal range of motion.  Neurological: He is alert and oriented to person, place, and time.  Skin: Skin is warm, dry and intact. No rash noted. No erythema. No pallor.  Psychiatric: He has a normal mood and affect. His speech is normal and behavior is normal. Judgment and thought content normal. Cognition and memory are normal.  Vitals reviewed.   Results for orders placed or performed in visit on 05/02/15  [facility] screen ([facility])  Result Value Ref Range  Strep Gp A Ag, IA W/Reflex Negative Negative  Culture, Group A Strep  Result Value Ref Range   Strep A Culture Comment (A)   Mononucleosis screen  Result Value Ref Range   Mono Screen Negative Negative      Assessment & Plan:   Problem List Items Addressed This Visit      Other   ADD (attention deficit disorder) - Primary    Will stop his vyvanse and increase his adderall to 20mg  daily. Recheck 3 months. 3 month supply given today. Call with any concerns.        Other  Visit Diagnoses    Immunization due       Flu shot given today.   Relevant Orders   Flu Vaccine QUAD 36+ mos PF IM (Fluarix & Fluzone Quad PF) (Completed)       Follow up plan: Return in about 3 months (around 03/06/2016) for ADD follow up and shots.

## 2015-12-06 ENCOUNTER — Ambulatory Visit: Payer: BLUE CROSS/BLUE SHIELD | Admitting: Family Medicine

## 2015-12-19 ENCOUNTER — Ambulatory Visit (INDEPENDENT_AMBULATORY_CARE_PROVIDER_SITE_OTHER): Payer: Self-pay | Admitting: Sports Medicine

## 2015-12-23 ENCOUNTER — Telehealth (INDEPENDENT_AMBULATORY_CARE_PROVIDER_SITE_OTHER): Payer: Self-pay

## 2015-12-23 NOTE — Telephone Encounter (Signed)
Patient was told to keep his Wednesday appointment, if not patient dad can call to come in earlier.

## 2015-12-23 NOTE — Telephone Encounter (Signed)
Patient dad called stating that son was still in pain.  Patient dad talked with Dr. Berline Choughigby today.

## 2016-02-24 ENCOUNTER — Ambulatory Visit: Payer: BLUE CROSS/BLUE SHIELD

## 2016-03-06 ENCOUNTER — Encounter: Payer: Self-pay | Admitting: Family Medicine

## 2016-03-06 ENCOUNTER — Ambulatory Visit (INDEPENDENT_AMBULATORY_CARE_PROVIDER_SITE_OTHER): Payer: BLUE CROSS/BLUE SHIELD | Admitting: Family Medicine

## 2016-03-06 VITALS — BP 125/80 | HR 102 | Temp 98.3°F | Wt 135.0 lb

## 2016-03-06 DIAGNOSIS — F988 Other specified behavioral and emotional disorders with onset usually occurring in childhood and adolescence: Secondary | ICD-10-CM

## 2016-03-06 MED ORDER — AMPHETAMINE-DEXTROAMPHETAMINE 20 MG PO TABS: 20.0000 mg | ORAL_TABLET | Freq: Every day | ORAL | 0 refills | Status: DC

## 2016-03-06 MED ORDER — CYPROHEPTADINE HCL 4 MG PO TABS: 4.0000 mg | ORAL_TABLET | Freq: Every day | ORAL | 12 refills | Status: DC | PRN

## 2016-03-06 NOTE — Progress Notes (Signed)
BP 125/80   Pulse (!) 102   Temp 98.3 F (36.8 C)   Wt 135 lb (61.2 kg)   SpO2 99%   BMI 16.65 kg/m    Subjective:    Patient ID: Kevin Townsend, male    DOB: 10/17/1995, 20 y.o.   MRN: [id number]  HPI: Kevin Townsend is a 21 y.o. male  Chief Complaint  Patient presents with  . ADHD    doing well with his medicine.   ADHD FOLLOW UP ADHD status: controlled Satisfied with current therapy: yes Medication compliance:  excellent compliance Controlled substance contract: yes Previous psychiatry evaluation: yes Previous medications: yes    Taking meds on weekends/vacations: occasionally Work/school performance:  good Difficulty sustaining attention/completing tasks: no Distracted by extraneous stimuli: no Does not listen when spoken to: no  Fidgets with hands or feet: no Unable to stay in seat: no Blurts out/interrupts others: no ADHD Medication Side Effects: yes    Decreased appetite: yes    Headache: no    Sleeping disturbance pattern: no    Irritability: no    Rebound effects (worse than baseline) off medication: no    Anxiousness: no    Dizziness: no    Tics: no  Relevant past medical, surgical, family and social history reviewed and updated as indicated. Interim medical history since our last visit reviewed. Allergies and medications reviewed and updated.  Review of Systems  Constitutional: Negative.   Respiratory: Negative.   Cardiovascular: Negative.   Psychiatric/Behavioral: Negative.     Per HPI unless specifically indicated above      Objective:    BP 125/80   Pulse (!) 102   Temp 98.3 F (36.8 C)   Wt 135 lb (61.2 kg)   SpO2 99%   BMI 16.65 kg/m   Wt Readings from Last 3 Encounters:  03/06/16 135 lb (61.2 kg)  12/05/15 137 lb 3.2 oz (62.2 kg)  10/27/15 136 lb (61.7 kg)    Physical Exam  Constitutional: He is oriented to person, place, and time. He appears well-developed and well-nourished. No distress.  HENT:  Head: Normocephalic and  atraumatic.  Right Ear: Hearing normal.  Left Ear: Hearing normal.  Nose: Nose normal.  Eyes: Conjunctivae and lids are normal. Right eye exhibits no discharge. Left eye exhibits no discharge. No scleral icterus.  Cardiovascular: Normal rate, regular rhythm, normal heart sounds and intact distal pulses.  Exam reveals no gallop and no friction rub.   No murmur heard. Pulmonary/Chest: Effort normal and breath sounds normal. No respiratory distress. He has no wheezes. He has no rales. He exhibits no tenderness.  Musculoskeletal: Normal range of motion.  Neurological: He is alert and oriented to person, place, and time.  Skin: Skin is warm, dry and intact. No rash noted. He is not diaphoretic. No erythema. No pallor.  Psychiatric: He has a normal mood and affect. His speech is normal and behavior is normal. Judgment and thought content normal. Cognition and memory are normal.  Nursing note and vitals reviewed.   Results for orders placed or performed in visit on 05/02/15  [facility] screen ([facility])  Result Value Ref Range   Strep Gp A Ag, IA W/Reflex Negative Negative  Culture, Group A Strep  Result Value Ref Range   Strep A Culture Comment (A)   Mononucleosis screen  Result Value Ref Range   Mono Screen Negative Negative      Assessment & Plan:   Problem List Items Addressed This Visit  Other   ADD (attention deficit disorder) - Primary    Will get him restarted on his periactin. Continue current regimen. Avoid going up because of weight loss. Call with any concerns. Encouraged [employer].           Follow up plan: Return in about 3 months (around 06/04/2016) for ADD follow up.

## 2016-03-06 NOTE — Assessment & Plan Note (Signed)
Will get him restarted on his periactin. Continue current regimen. Avoid going up because of weight loss. Call with any concerns. Encouraged [employer].

## 2016-05-15 ENCOUNTER — Ambulatory Visit: Payer: BLUE CROSS/BLUE SHIELD | Admitting: Family Medicine

## 2016-06-08 ENCOUNTER — Encounter: Payer: Self-pay | Admitting: Family Medicine

## 2016-06-08 ENCOUNTER — Ambulatory Visit (INDEPENDENT_AMBULATORY_CARE_PROVIDER_SITE_OTHER): Payer: BLUE CROSS/BLUE SHIELD | Admitting: Family Medicine

## 2016-06-08 VITALS — BP 114/74 | HR 73 | Temp 97.6°F | Wt 131.5 lb

## 2016-06-08 DIAGNOSIS — R636 Underweight: Secondary | ICD-10-CM | POA: Diagnosis not present

## 2016-06-08 DIAGNOSIS — F988 Other specified behavioral and emotional disorders with onset usually occurring in childhood and adolescence: Secondary | ICD-10-CM

## 2016-06-08 MED ORDER — CYPROHEPTADINE HCL 4 MG PO TABS: 4.0000 mg | ORAL_TABLET | Freq: Every day | ORAL | 12 refills | Status: DC | PRN

## 2016-06-08 MED ORDER — AMPHETAMINE-DEXTROAMPHETAMINE 20 MG PO TABS: 20.0000 mg | ORAL_TABLET | Freq: Every day | ORAL | 0 refills | Status: DC

## 2016-06-08 NOTE — Assessment & Plan Note (Signed)
Under good control. Will continue current regimen. Refill given today. Call with any concerns.

## 2016-06-08 NOTE — Progress Notes (Signed)
BP 114/74 (BP Location: Left Arm, Patient Position: Sitting, Cuff Size: Normal)   Pulse 73   Temp 97.6 F (36.4 C)   Wt 131 lb 8 oz (59.6 kg)   SpO2 99%   BMI 16.22 kg/m    Subjective:    Patient ID: Kevin Townsend, male    DOB: [birth year], 20 y.o.   MRN: [id number]  HPI: Kevin Townsend is a 21 y.o. male  Chief Complaint  Patient presents with  . ADHD   ADHD FOLLOW UP- has been out of his adderall for about 2 weeks because he missed his appointment. Finals are coming up and he is noticing a difference ADHD status: controlled Satisfied with current therapy: yes Medication compliance:  excellent compliance Controlled substance contract: yes Previous psychiatry evaluation: yes Previous medications: yes    Taking meds on weekends/vacations: occasionally Work/school performance:  good Difficulty sustaining attention/completing tasks: no Distracted by extraneous stimuli: no Does not listen when spoken to: no  Fidgets with hands or feet: no Unable to stay in seat: no Blurts out/interrupts others: no ADHD Medication Side Effects: yes    Decreased appetite: yes    Headache: no    Sleeping disturbance pattern: no    Irritability: no    Rebound effects (worse than baseline) off medication: no    Anxiousness: no    Dizziness: no    Tics: no  Relevant past medical, surgical, family and social history reviewed and updated as indicated. Interim medical history since our last visit reviewed. Allergies and medications reviewed and updated.  Review of Systems  Constitutional: Negative.   Respiratory: Negative.   Cardiovascular: Negative.   Psychiatric/Behavioral: Negative.     Per HPI unless specifically indicated above     Objective:    BP 114/74 (BP Location: Left Arm, Patient Position: Sitting, Cuff Size: Normal)   Pulse 73   Temp 97.6 F (36.4 C)   Wt 131 lb 8 oz (59.6 kg)   SpO2 99%   BMI 16.22 kg/m   Wt Readings from Last 3 Encounters:  06/08/16 131 lb 8 oz  (59.6 kg)  03/06/16 135 lb (61.2 kg)  12/05/15 137 lb 3.2 oz (62.2 kg)    Physical Exam  Constitutional: He is oriented to person, place, and time. He appears well-developed and well-nourished. No distress.  HENT:  Head: Normocephalic and atraumatic.  Right Ear: Hearing normal.  Left Ear: Hearing normal.  Nose: Nose normal.  Eyes: Conjunctivae and lids are normal. Right eye exhibits no discharge. Left eye exhibits no discharge. No scleral icterus.  Pulmonary/Chest: Effort normal. No respiratory distress.  Musculoskeletal: Normal range of motion.  Neurological: He is alert and oriented to person, place, and time.  Skin: Skin is intact. No rash noted.  Psychiatric: He has a normal mood and affect. His speech is normal and behavior is normal. Judgment and thought content normal. Cognition and memory are normal.    Results for orders placed or performed in visit on 05/02/15  Rapid strep screen (not at [facility])  Result Value Ref Range   Strep Gp A Ag, IA W/Reflex Negative Negative  Culture, Group A Strep  Result Value Ref Range   Strep A Culture Comment (A)   Mononucleosis screen  Result Value Ref Range   Mono Screen Negative Negative      Assessment & Plan:   Problem List Items Addressed This Visit      Other   ADD (attention deficit disorder) - Primary    Under good  control. Will continue current regimen. Refill given today. Call with any concerns.       Underweight    Encouraged him to take periactin. Start ensure. Continue to monitor.           Follow up plan: Return in about 3 months (around 09/08/2016) for Physical and ADD follow up.

## 2016-06-08 NOTE — Assessment & Plan Note (Signed)
Encouraged him to take periactin. Start ensure. Continue to monitor.

## 2016-09-10 ENCOUNTER — Ambulatory Visit (INDEPENDENT_AMBULATORY_CARE_PROVIDER_SITE_OTHER): Payer: BLUE CROSS/BLUE SHIELD | Admitting: Family Medicine

## 2016-09-10 ENCOUNTER — Encounter: Payer: Self-pay | Admitting: Family Medicine

## 2016-09-10 VITALS — BP 112/72 | HR 67 | Temp 98.1°F | Wt 135.6 lb

## 2016-09-10 DIAGNOSIS — F988 Other specified behavioral and emotional disorders with onset usually occurring in childhood and adolescence: Secondary | ICD-10-CM | POA: Diagnosis not present

## 2016-09-10 MED ORDER — AMPHETAMINE-DEXTROAMPHETAMINE 20 MG PO TABS: 20.0000 mg | ORAL_TABLET | Freq: Every day | ORAL | 0 refills | Status: DC

## 2016-09-10 MED ORDER — AMPHETAMINE-DEXTROAMPHETAMINE 20 MG PO TABS
20.0000 mg | ORAL_TABLET | Freq: Every day | ORAL | 0 refills | Status: DC
Start: 2016-09-10 — End: 2016-12-13

## 2016-09-10 NOTE — Assessment & Plan Note (Signed)
Not under great control- wearing off about 4-5 hours in. Will add additional pill on days he needs it in the PM. Call with any concerns. Recheck 3 months. 3 months supply given.

## 2016-09-10 NOTE — Progress Notes (Signed)
BP 112/72 (BP Location: Left Arm, Patient Position: Sitting, Cuff Size: Normal)   Pulse 67   Temp 98.1 F (36.7 C)   Wt 135 lb 9 oz (61.5 kg)   SpO2 99%   BMI 16.72 kg/m    Subjective:    Patient ID: Kevin Townsend, male    DOB: 26-Jul-1995, 20 y.o.   MRN: [id number]  HPI: Kevin Townsend is a 21 y.o. male  Chief Complaint  Patient presents with  . ADHD   ADHD FOLLOW UP- he is noticing that the 20mg  is lasting about 4-5 hours and then starts to run out. He has been taking a 2nd one about 2-3:00. No problems falling asleep. He has evening classes which makes it harder. Has been doing that about 4-5x a week. ADHD status: controlled Satisfied with current therapy: no Medication compliance:  excellent compliance Controlled substance contract: no Previous psychiatry evaluation: yes Previous medications: yes    Taking meds on weekends/vacations: occasionally Work/school performance:  excellent Difficulty sustaining attention/completing tasks: no Distracted by extraneous stimuli: no Does not listen when spoken to: no  Fidgets with hands or feet: no Unable to stay in seat: no Blurts out/interrupts others: no ADHD Medication Side Effects: no    Decreased appetite: no    Headache: no    Sleeping disturbance pattern: no    Irritability: no    Rebound effects (worse than baseline) off medication: no    Anxiousness: no    Dizziness: no    Tics: no  Relevant past medical, surgical, family and social history reviewed and updated as indicated. Interim medical history since our last visit reviewed. Allergies and medications reviewed and updated.  Review of Systems  Constitutional: Negative.   Respiratory: Negative.   Cardiovascular: Negative.   Psychiatric/Behavioral: Negative.     Per HPI unless specifically indicated above     Objective:    BP 112/72 (BP Location: Left Arm, Patient Position: Sitting, Cuff Size: Normal)   Pulse 67   Temp 98.1 F (36.7 C)   Wt 135 lb 9 oz  (61.5 kg)   SpO2 99%   BMI 16.72 kg/m   Wt Readings from Last 3 Encounters:  09/10/16 135 lb 9 oz (61.5 kg)  06/08/16 131 lb 8 oz (59.6 kg)  03/06/16 135 lb (61.2 kg)    Physical Exam  Constitutional: He is oriented to person, place, and time. He appears well-developed and well-nourished. No distress.  HENT:  Head: Normocephalic and atraumatic.  Right Ear: Hearing normal.  Left Ear: Hearing normal.  Nose: Nose normal.  Eyes: Conjunctivae and lids are normal. Right eye exhibits no discharge. Left eye exhibits no discharge. No scleral icterus.  Cardiovascular: Normal rate, regular rhythm, normal heart sounds and intact distal pulses.  Exam reveals no gallop and no friction rub.   No murmur heard. Pulmonary/Chest: Effort normal and breath sounds normal. No respiratory distress. He has no wheezes. He has no rales. He exhibits no tenderness.  Musculoskeletal: Normal range of motion.  Neurological: He is alert and oriented to person, place, and time.  Skin: Skin is warm, dry and intact. No rash noted. He is not diaphoretic. No erythema. No pallor.  Psychiatric: He has a normal mood and affect. His speech is normal and behavior is normal. Judgment and thought content normal. Cognition and memory are normal.  Nursing note and vitals reviewed.   Results for orders placed or performed in visit on 05/02/15  Rapid strep screen (not at [facility])  Result Value  Ref Range   Strep Gp A Ag, IA W/Reflex Negative Negative  Culture, Group A Strep  Result Value Ref Range   Strep A Culture Comment (A)   Mononucleosis screen  Result Value Ref Range   Mono Screen Negative Negative      Assessment & Plan:   Problem List Items Addressed This Visit      Other   ADD (attention deficit disorder) - Primary    Not under great control- wearing off about 4-5 hours in. Will add additional pill on days he needs it in the PM. Call with any concerns. Recheck 3 months. 3 months supply given.           Follow  up plan: Return in about 3 months (around 12/11/2016) for Physical/ADD follow up.

## 2016-09-18 ENCOUNTER — Encounter: Payer: Self-pay | Admitting: Family Medicine

## 2016-09-18 ENCOUNTER — Ambulatory Visit (INDEPENDENT_AMBULATORY_CARE_PROVIDER_SITE_OTHER): Payer: BLUE CROSS/BLUE SHIELD | Admitting: Family Medicine

## 2016-09-18 VITALS — BP 108/71 | HR 108 | Temp 98.9°F | Wt 131.0 lb

## 2016-09-18 DIAGNOSIS — J029 Acute pharyngitis, unspecified: Secondary | ICD-10-CM | POA: Diagnosis not present

## 2016-09-18 MED ORDER — CETIRIZINE HCL 10 MG PO TABS: 10.0000 mg | ORAL_TABLET | Freq: Every day | ORAL | 11 refills | Status: DC

## 2016-09-18 MED ORDER — AMOXICILLIN-POT CLAVULANATE 875-125 MG PO TABS: 1.0000 | ORAL_TABLET | Freq: Two times a day (BID) | ORAL | 0 refills | Status: DC

## 2016-09-18 MED ORDER — FLUTICASONE PROPIONATE 50 MCG/ACT NA SUSP: 2.0000 | Freq: Every day | NASAL | 6 refills | Status: DC

## 2016-09-18 NOTE — Patient Instructions (Signed)
Follow up as needed

## 2016-09-18 NOTE — Progress Notes (Signed)
BP 108/71   Pulse (!) 108   Temp 98.9 F (37.2 C)   Wt 131 lb (59.4 kg)   SpO2 99%   BMI 16.16 kg/m    Subjective:    Patient ID: Kevin Townsend, male    DOB: 08/26/1995, 20 y.o.   MRN: [id number]  HPI: Kevin Townsend is a 21 y.o. male  Chief Complaint  Patient presents with  . Sore Throat    x 5 days, dry cough, pressure in his ears, nasal congestion. slight chest congestion. No fever.   Patient presents with 5 day hx of sore throat, dry cough, post-nasal drainage, ear pressure, chest tightness. Denies fever, chills, CP, SOB. No sick contacts. Taking tylenol with no relief.   Relevant past medical, surgical, family and social history reviewed and updated as indicated. Interim medical history since our last visit reviewed. Allergies and medications reviewed and updated.  Review of Systems  Constitutional: Negative.   HENT: Positive for congestion, ear pain (pressure) and sore throat.   Eyes: Negative.   Respiratory: Positive for cough and chest tightness.   Cardiovascular: Negative.   Gastrointestinal: Negative.   Genitourinary: Negative.   Musculoskeletal: Negative.   Neurological: Negative.   Psychiatric/Behavioral: Negative.    Per HPI unless specifically indicated above     Objective:    BP 108/71   Pulse (!) 108   Temp 98.9 F (37.2 C)   Wt 131 lb (59.4 kg)   SpO2 99%   BMI 16.16 kg/m   Wt Readings from Last 3 Encounters:  09/18/16 131 lb (59.4 kg)  09/10/16 135 lb 9 oz (61.5 kg)  06/08/16 131 lb 8 oz (59.6 kg)    Physical Exam  Constitutional: He is oriented to person, place, and time. He appears well-developed and well-nourished. No distress.  HENT:  Head: Atraumatic.  Right Ear: External ear normal.  Left Ear: External ear normal.  Nasal mucosa injected Oropharynx erythematous and mildly edematous  Eyes: Pupils are equal, round, and reactive to light. Conjunctivae are normal. No scleral icterus.  Neck: Normal range of motion. Neck supple.    Cardiovascular: Normal rate and normal heart sounds.   Pulmonary/Chest: Effort normal and breath sounds normal. No respiratory distress.  Musculoskeletal: Normal range of motion.  Lymphadenopathy:    He has no cervical adenopathy.  Neurological: He is alert and oriented to person, place, and time.  Skin: Skin is warm and dry.  Psychiatric: He has a normal mood and affect. His behavior is normal.  Nursing note and vitals reviewed.   Results for orders placed or performed in visit on 05/02/15  Rapid strep screen (not at [facility])  Result Value Ref Range   Strep Gp A Ag, IA W/Reflex Negative Negative  Culture, Group A Strep  Result Value Ref Range   Strep A Culture Comment (A)   Mononucleosis screen  Result Value Ref Range   Mono Screen Negative Negative      Assessment & Plan:   Problem List Items Addressed This Visit    None    Visit Diagnoses    Pharyngitis, unspecified etiology    -  Primary   Relevant Orders   Rapid strep screen (not at [facility])    Suspect viral infection. Rapid strep negative, but hx of positive throat cx after neg strep cx. Augmentin sent for if no improvement by end of week. Await culture results. Zyrtec, flonase, and throat sprays until then for symptomatic improvement. F/u if worsening or no improvement.  Follow up plan: Return if symptoms worsen or fail to improve.

## 2016-09-21 LAB — CULTURE, GROUP A STREP: STREP A CULTURE: NEGATIVE

## 2016-09-21 LAB — RAPID STREP SCREEN (MED CTR MEBANE ONLY): Strep Gp A Ag, IA W/Reflex: NEGATIVE

## 2016-12-13 ENCOUNTER — Ambulatory Visit (INDEPENDENT_AMBULATORY_CARE_PROVIDER_SITE_OTHER): Payer: BLUE CROSS/BLUE SHIELD | Admitting: Family Medicine

## 2016-12-13 ENCOUNTER — Encounter: Payer: Self-pay | Admitting: Family Medicine

## 2016-12-13 DIAGNOSIS — F988 Other specified behavioral and emotional disorders with onset usually occurring in childhood and adolescence: Secondary | ICD-10-CM

## 2016-12-13 MED ORDER — AMPHETAMINE-DEXTROAMPHETAMINE 20 MG PO TABS: 20.0000 mg | ORAL_TABLET | Freq: Every day | ORAL | 0 refills | Status: DC

## 2016-12-13 NOTE — Assessment & Plan Note (Signed)
Under good control. Weight stable. Continue current regimen. Continue to monitor. Call with any concerns. Refills for 3 months given today.

## 2016-12-13 NOTE — Progress Notes (Signed)
BP 105/68   Pulse 84   Temp 98.2 F (36.8 C) (Oral)   Ht 6' 3.2" (1.91 m)   Wt 133 lb 12.8 oz (60.7 kg)   SpO2 96%   BMI 16.64 kg/m    Subjective:    Patient ID: Kevin Townsend, male    DOB: 01/29/1996, 21 y.o.   MRN: [id number]  HPI: Kevin Brillicholas Hiott is a 21 y.o. male  Chief Complaint  Patient presents with  . ADHD   ADHD FOLLOW UP ADHD status: controlled Satisfied with current therapy: yes Medication compliance:  excellent compliance Controlled substance contract: yes Previous psychiatry evaluation: yes Previous medications: yes    Taking meds on weekends/vacations: yes Work/school performance:  excellent Difficulty sustaining attention/completing tasks: no Distracted by extraneous stimuli: no Does not listen when spoken to: no  Fidgets with hands or feet: no Unable to stay in seat: no Blurts out/interrupts others: no ADHD Medication Side Effects: no    Decreased appetite: no    Headache: no    Sleeping disturbance pattern: no    Irritability: no    Rebound effects (worse than baseline) off medication: no    Anxiousness: no    Dizziness: no    Tics: no   Relevant past medical, surgical, family and social history reviewed and updated as indicated. Interim medical history since our last visit reviewed. Allergies and medications reviewed and updated.  Review of Systems  Constitutional: Negative.   Respiratory: Negative.   Cardiovascular: Negative.   Psychiatric/Behavioral: Negative.     Per HPI unless specifically indicated above     Objective:    BP 105/68   Pulse 84   Temp 98.2 F (36.8 C) (Oral)   Ht 6' 3.2" (1.91 m)   Wt 133 lb 12.8 oz (60.7 kg)   SpO2 96%   BMI 16.64 kg/m   Wt Readings from Last 3 Encounters:  12/13/16 133 lb 12.8 oz (60.7 kg)  09/18/16 131 lb (59.4 kg)  09/10/16 135 lb 9 oz (61.5 kg)    Physical Exam  Constitutional: He is oriented to person, place, and time. He appears well-developed and well-nourished. No distress.    HENT:  Head: Normocephalic and atraumatic.  Right Ear: Hearing normal.  Left Ear: Hearing normal.  Nose: Nose normal.  Eyes: Conjunctivae and lids are normal. Right eye exhibits no discharge. Left eye exhibits no discharge. No scleral icterus.  Cardiovascular: Normal rate, regular rhythm, normal heart sounds and intact distal pulses. Exam reveals no gallop and no friction rub.  No murmur heard. Pulmonary/Chest: Effort normal and breath sounds normal. No respiratory distress. He has no wheezes. He has no rales. He exhibits no tenderness.  Musculoskeletal: Normal range of motion.  Neurological: He is alert and oriented to person, place, and time.  Skin: Skin is warm, dry and intact. No rash noted. He is not diaphoretic. No erythema. No pallor.  Psychiatric: He has a normal mood and affect. His speech is normal and behavior is normal. Judgment and thought content normal. Cognition and memory are normal.  Nursing note and vitals reviewed.   Results for orders placed or performed in visit on 09/18/16  Rapid [facility] screen ([facility])  Result Value Ref Range   Strep Gp A Ag, IA W/Reflex Negative Negative  Culture, Group A Strep  Result Value Ref Range   Strep A Culture Negative       Assessment & Plan:   Problem List Items Addressed This Visit  Other   ADD (attention deficit disorder)    Under good control. Weight stable. Continue current regimen. Continue to monitor. Call with any concerns. Refills for 3 months given today.          Follow up plan: Return in about 3 months (around 03/15/2017) for Physical and ADD follow up.

## 2017-03-08 ENCOUNTER — Ambulatory Visit: Payer: BLUE CROSS/BLUE SHIELD | Admitting: Family Medicine

## 2017-03-12 ENCOUNTER — Ambulatory Visit (INDEPENDENT_AMBULATORY_CARE_PROVIDER_SITE_OTHER): Payer: BLUE CROSS/BLUE SHIELD | Admitting: Family Medicine

## 2017-03-12 ENCOUNTER — Encounter: Payer: Self-pay | Admitting: Family Medicine

## 2017-03-12 VITALS — BP 100/69 | HR 105 | Temp 98.0°F | Wt 137.6 lb

## 2017-03-12 DIAGNOSIS — F988 Other specified behavioral and emotional disorders with onset usually occurring in childhood and adolescence: Secondary | ICD-10-CM | POA: Diagnosis not present

## 2017-03-12 MED ORDER — AMPHETAMINE-DEXTROAMPHET ER 20 MG PO CP24: 20.0000 mg | ORAL_CAPSULE | Freq: Every day | ORAL | 0 refills | Status: DC

## 2017-03-12 MED ORDER — AMPHETAMINE-DEXTROAMPHETAMINE 10 MG PO TABS: ORAL_TABLET | ORAL | 0 refills | Status: DC

## 2017-03-12 NOTE — Progress Notes (Signed)
BP 100/69 (BP Location: Left Arm, Patient Position: Sitting, Cuff Size: Normal)   Pulse (!) 105   Temp 98 F (36.7 C)   Wt 137 lb 9 oz (62.4 kg)   SpO2 97%   BMI 17.10 kg/m    Subjective:    Patient ID: Kevin Townsend, male    DOB: 09/16/1995, 22 y.o.   MRN: [id number]  HPI: Kevin Townsend is a 22 y.o. male  Chief Complaint  Patient presents with  . ADHD   ADHD FOLLOW UP- got accepted [facility] [employer] for [employer] design ADHD status: exacerbated, has been wearing off early, he's concerned about how he's going to do with a longer class schedule Satisfied with current therapy: no Medication compliance:  excellent compliance Controlled substance contract: yes Previous psychiatry evaluation: yes Previous medications: yes adderall, ritalin LA, ritalin SR and vyvanse (lisdexamfethamine)   Taking meds on weekends/vacations: yes Work/school performance:  good Difficulty sustaining attention/completing tasks: yes Distracted by extraneous stimuli: no Does not listen when spoken to: no  Fidgets with hands or feet: no Unable to stay in seat: no Blurts out/interrupts others: no ADHD Medication Side Effects: no    Decreased appetite: no    Headache: no    Sleeping disturbance pattern: no    Irritability: no    Rebound effects (worse than baseline) off medication: no    Anxiousness: no    Dizziness: no    Tics: no   Relevant past medical, surgical, family and social history reviewed and updated as indicated. Interim medical history since our last visit reviewed. Allergies and medications reviewed and updated.  Review of Systems  Constitutional: Negative.   Respiratory: Negative.   Cardiovascular: Negative.   Skin: Negative.   Psychiatric/Behavioral: Positive for decreased concentration. Negative for agitation, behavioral problems, confusion, dysphoric mood, hallucinations, self-injury, sleep disturbance and suicidal ideas. The patient is not nervous/anxious and is not hyperactive.      Per HPI unless specifically indicated above     Objective:    BP 100/69 (BP Location: Left Arm, Patient Position: Sitting, Cuff Size: Normal)   Pulse (!) 105   Temp 98 F (36.7 C)   Wt 137 lb 9 oz (62.4 kg)   SpO2 97%   BMI 17.10 kg/m   Wt Readings from Last 3 Encounters:  03/12/17 137 lb 9 oz (62.4 kg)  12/13/16 133 lb 12.8 oz (60.7 kg)  09/18/16 131 lb (59.4 kg)    Physical Exam  Constitutional: He is oriented to person, place, and time. He appears well-developed and well-nourished. No distress.  HENT:  Head: Normocephalic and atraumatic.  Right Ear: Hearing normal.  Left Ear: Hearing normal.  Nose: Nose normal.  Eyes: Conjunctivae and lids are normal. Right eye exhibits no discharge. Left eye exhibits no discharge. No scleral icterus.  Cardiovascular: Normal rate, regular rhythm, normal heart sounds and intact distal pulses. Exam reveals no gallop and no friction rub.  No murmur heard. Pulmonary/Chest: Effort normal and breath sounds normal. No respiratory distress. He has no wheezes. He has no rales. He exhibits no tenderness.  Musculoskeletal: Normal range of motion.  Neurological: He is alert and oriented to person, place, and time.  Skin: Skin is warm, dry and intact. No rash noted. He is not diaphoretic. No erythema. No pallor.  Psychiatric: He has a normal mood and affect. His speech is normal and behavior is normal. Judgment and thought content normal. Cognition and memory are normal.  Nursing note and vitals reviewed.   Results for  orders placed or performed in visit on 09/18/16  Rapid strep screen (not at [facility])  Result Value Ref Range   Strep Gp A Ag, IA W/Reflex Negative Negative  Culture, Group A Strep  Result Value Ref Range   Strep A Culture Negative       Assessment & Plan:   Problem List Items Addressed This Visit      Other   ADD (attention deficit disorder) - Primary    Not tolerating his adderall as well. Will change to long acting with  PRN short acting in the afternoon. Recheck 1 month. Call with any concerns.           Follow up plan: Return in about 4 weeks (around 04/09/2017), or ADD follow up.

## 2017-03-12 NOTE — Assessment & Plan Note (Signed)
Not tolerating his adderall as well. Will change to long acting with PRN short acting in the afternoon. Recheck 1 month. Call with any concerns.

## 2017-04-15 ENCOUNTER — Encounter: Payer: Self-pay | Admitting: Family Medicine

## 2017-04-15 ENCOUNTER — Ambulatory Visit (INDEPENDENT_AMBULATORY_CARE_PROVIDER_SITE_OTHER): Payer: BLUE CROSS/BLUE SHIELD | Admitting: Family Medicine

## 2017-04-15 VITALS — BP 115/72 | HR 83 | Temp 98.2°F | Wt 140.1 lb

## 2017-04-15 DIAGNOSIS — F988 Other specified behavioral and emotional disorders with onset usually occurring in childhood and adolescence: Secondary | ICD-10-CM | POA: Diagnosis not present

## 2017-04-15 MED ORDER — AMPHETAMINE-DEXTROAMPHET ER 20 MG PO CP24: 20.0000 mg | ORAL_CAPSULE | Freq: Every day | ORAL | 0 refills | Status: DC

## 2017-04-15 MED ORDER — AMPHETAMINE-DEXTROAMPHETAMINE 10 MG PO TABS: ORAL_TABLET | ORAL | 0 refills | Status: DC

## 2017-04-15 NOTE — Progress Notes (Signed)
BP 115/72 (BP Location: Left Arm, Patient Position: Sitting, Cuff Size: Normal)   Pulse 83   Temp 98.2 F (36.8 C)   Wt 140 lb 2 oz (63.6 kg)   SpO2 100%   BMI 17.42 kg/m    Subjective:    Patient ID: Kevin Townsend, male    DOB: 12/15/1995, 22 y.o.   MRN: [id number]  HPI: Kevin Townsend is a 22 y.o. male  Chief Complaint  Patient presents with  . ADD   ADHD FOLLOW UP ADHD status: better Satisfied with current therapy: yes Medication compliance:  excellent compliance Controlled substance contract: yes Previous psychiatry evaluation: yes Previous medications: yes    Taking meds on weekends/vacations: yes Work/school performance:  good Difficulty sustaining attention/completing tasks: no Distracted by extraneous stimuli: no Does not listen when spoken to: no  Fidgets with hands or feet: no Unable to stay in seat: no Blurts out/interrupts others: no ADHD Medication Side Effects: no    Decreased appetite: no    Headache: no    Sleeping disturbance pattern: no    Irritability: no    Rebound effects (worse than baseline) off medication: no    Anxiousness: no    Dizziness: no    Tics: no   Relevant past medical, surgical, family and social history reviewed and updated as indicated. Interim medical history since our last visit reviewed. Allergies and medications reviewed and updated.  Review of Systems  Constitutional: Negative.   Respiratory: Negative.   Cardiovascular: Negative.   Psychiatric/Behavioral: Negative.     Per HPI unless specifically indicated above     Objective:    BP 115/72 (BP Location: Left Arm, Patient Position: Sitting, Cuff Size: Normal)   Pulse 83   Temp 98.2 F (36.8 C)   Wt 140 lb 2 oz (63.6 kg)   SpO2 100%   BMI 17.42 kg/m   Wt Readings from Last 3 Encounters:  04/15/17 140 lb 2 oz (63.6 kg)  03/12/17 137 lb 9 oz (62.4 kg)  12/13/16 133 lb 12.8 oz (60.7 kg)    Physical Exam  Constitutional: He is oriented to person, place,  and time. He appears well-developed and well-nourished. No distress.  HENT:  Head: Normocephalic and atraumatic.  Right Ear: Hearing normal.  Left Ear: Hearing normal.  Nose: Nose normal.  Eyes: Conjunctivae and lids are normal. Right eye exhibits no discharge. Left eye exhibits no discharge. No scleral icterus.  Cardiovascular: Normal rate, regular rhythm, normal heart sounds and intact distal pulses. Exam reveals no gallop and no friction rub.  No murmur heard. Pulmonary/Chest: Effort normal and breath sounds normal. No respiratory distress. He has no wheezes. He has no rales. He exhibits no tenderness.  Musculoskeletal: Normal range of motion.  Neurological: He is alert and oriented to person, place, and time.  Skin: Skin is warm, dry and intact. No rash noted. He is not diaphoretic. No erythema. No pallor.  Psychiatric: He has a normal mood and affect. His speech is normal and behavior is normal. Judgment and thought content normal. Cognition and memory are normal.  Nursing note and vitals reviewed.   Results for orders placed or performed in visit on 09/18/16  Rapid [facility] screen (not at [facility])  Result Value Ref Range   Strep Gp A Ag, IA W/Reflex Negative Negative  Culture, Group A Strep  Result Value Ref Range   Strep A Culture Negative       Assessment & Plan:   Problem List Items Addressed This Visit  Other   ADD (attention deficit disorder) - Primary    Doing well on current regimen. Will continue current regimen. Refills for 3 months on the extended release and 1 refill of short acting given. He will call if he needs a refill on the short acting.           Follow up plan: Return in about 3 months (around 07/16/2017) for follow up ADD.

## 2017-04-15 NOTE — Assessment & Plan Note (Signed)
Doing well on current regimen. Will continue current regimen. Refills for 3 months on the extended release and 1 refill of short acting given. He will call if he needs a refill on the short acting.

## 2017-05-02 ENCOUNTER — Other Ambulatory Visit: Payer: Self-pay

## 2017-05-02 ENCOUNTER — Encounter (HOSPITAL_COMMUNITY): Payer: Self-pay | Admitting: *Deleted

## 2017-05-02 ENCOUNTER — Emergency Department (HOSPITAL_COMMUNITY): Payer: BLUE CROSS/BLUE SHIELD

## 2017-05-02 ENCOUNTER — Emergency Department (HOSPITAL_COMMUNITY)
Admission: EM | Admit: 2017-05-02 | Discharge: 2017-05-02 | Disposition: A | Discharge status: Home / Self Care | Payer: BLUE CROSS/BLUE SHIELD | Attending: Emergency Medicine | Admitting: Emergency Medicine

## 2017-05-02 ENCOUNTER — Encounter (HOSPITAL_COMMUNITY)
Admission: EM | Disposition: A | Discharge status: Home / Self Care | Payer: Self-pay | Source: Home / Self Care | Attending: Emergency Medicine

## 2017-05-02 DIAGNOSIS — Y9259 Other trade areas as the place of occurrence of the external cause: Secondary | ICD-10-CM | POA: Insufficient documentation

## 2017-05-02 DIAGNOSIS — T189XXA Foreign body of alimentary tract, part unspecified, initial encounter: Secondary | ICD-10-CM

## 2017-05-02 DIAGNOSIS — Z87891 Personal history of nicotine dependence: Secondary | ICD-10-CM | POA: Diagnosis not present

## 2017-05-02 DIAGNOSIS — X58XXXA Exposure to other specified factors, initial encounter: Secondary | ICD-10-CM | POA: Diagnosis not present

## 2017-05-02 DIAGNOSIS — F909 Attention-deficit hyperactivity disorder, unspecified type: Secondary | ICD-10-CM | POA: Insufficient documentation

## 2017-05-02 DIAGNOSIS — Y990 Civilian activity done for income or pay: Secondary | ICD-10-CM | POA: Diagnosis not present

## 2017-05-02 DIAGNOSIS — T182XXA Foreign body in stomach, initial encounter: Secondary | ICD-10-CM | POA: Diagnosis not present

## 2017-05-02 DIAGNOSIS — Z79899 Other long term (current) drug therapy: Secondary | ICD-10-CM | POA: Insufficient documentation

## 2017-05-02 DIAGNOSIS — Y9389 Activity, other specified: Secondary | ICD-10-CM | POA: Insufficient documentation

## 2017-05-02 HISTORY — DX: Foreign body of alimentary tract, part unspecified, initial encounter: T18.9XXA

## 2017-05-02 HISTORY — PX: ESOPHAGOGASTRODUODENOSCOPY: SHX5428

## 2017-05-02 SURGERY — EGD (ESOPHAGOGASTRODUODENOSCOPY)
Anesthesia: Moderate Sedation

## 2017-05-02 MED ORDER — FENTANYL CITRATE (PF) 100 MCG/2ML IJ SOLN
INTRAMUSCULAR | Status: AC
  Filled 2017-05-02: qty 4

## 2017-05-02 MED ORDER — FENTANYL CITRATE (PF) 100 MCG/2ML IJ SOLN
INTRAMUSCULAR | Status: DC | PRN
  Administered 2017-05-02 (×4): 25 ug via INTRAVENOUS

## 2017-05-02 MED ORDER — MIDAZOLAM HCL 5 MG/ML IJ SOLN
INTRAMUSCULAR | Status: AC
  Filled 2017-05-02: qty 3

## 2017-05-02 MED ORDER — DIPHENHYDRAMINE HCL 50 MG/ML IJ SOLN
INTRAMUSCULAR | Status: AC
  Filled 2017-05-02: qty 1

## 2017-05-02 MED ORDER — MIDAZOLAM HCL 10 MG/2ML IJ SOLN
INTRAMUSCULAR | Status: DC | PRN
  Administered 2017-05-02 (×2): 2 mg via INTRAVENOUS
  Administered 2017-05-02: 1 mg via INTRAVENOUS
  Administered 2017-05-02 (×2): 2 mg via INTRAVENOUS
  Administered 2017-05-02: 1 mg via INTRAVENOUS
  Administered 2017-05-02: 2 mg via INTRAVENOUS

## 2017-05-02 MED ORDER — BUTAMBEN-TETRACAINE-BENZOCAINE 2-2-14 % EX AERO
INHALATION_SPRAY | CUTANEOUS | Status: DC | PRN
  Administered 2017-05-02: 1 via TOPICAL

## 2017-05-02 NOTE — ED Notes (Signed)
Endoscopy at bedside. 

## 2017-05-02 NOTE — Op Note (Addendum)
[facility] Patient Name: Kevin Townsend Procedure Date: 05/02/2017 MRN: [id number] Attending MD: Iva Booparl E Toneisha Savary , MD Date of Birth: 04/11/1995 CSN: [id number] Age: [age] Admit Type: Emergency Department Procedure:                Upper GI endoscopy Indications:              Foreign body in the stomach - accidental ingestion                            of wrench Providers:                Iva Booparl E. Zona Pedro, MD, Roselie AwkwardShannon Love, RN, Madalyn RobMarilyn                            Everhart, Technician, Harrington ChallengerHope Parker, Technician Referring MD:              Medicines:                Fentanyl 100 micrograms IV, Midazolam 10 mg IV Complications:            No immediate complications. Estimated Blood Loss:     Estimated blood loss: none. Procedure:                Pre-Anesthesia Assessment:                           - Prior to the procedure, a History and Physical                            was performed, and patient medications and                            allergies were reviewed. The patient's tolerance of                            previous anesthesia was also reviewed. The risks                            and benefits of the procedure and the sedation                            options and risks were discussed with the patient.                            All questions were answered, and informed consent                            was obtained. Prior Anticoagulants: The patient has                            taken no previous anticoagulant or antiplatelet                            agents. ASA Grade Assessment: I - A normal, healthy  patient. After reviewing the risks and benefits,                            the patient was deemed in satisfactory condition to                            undergo the procedure.                           After obtaining informed consent, the endoscope was                            passed under direct vision. Throughout the         procedure, the patient's blood pressure, pulse, and                            oxygen saturations were monitored continuously. The                            Endoscope was introduced through the mouth, and                            advanced to the second part of duodenum. The upper                            GI endoscopy was accomplished without difficulty.                            The patient tolerated the procedure well. Scope In: Scope Out: Findings:      A wrench was found on the greater curvature of the stomach. It was       discovered and seen only after I used suction with scopes and a gastric       lavage tube to reduce burden of retained food. Removal was accomplished       with a snare - 10 mm stiff snare after unsuccessful attempts with tripod       and softer snare. Estimated blood loss: none.      The exam was otherwise without abnormality. Limited by food retention.      The cardia and gastric fundus were otherwise normal on retroflexion       limited by food retention. Impression:               - A wrench was found in the stomach. Removal was                            successful.                           - The examination was otherwise normal. Moderate Sedation:      Moderate (conscious) sedation was administered by the endoscopy nurse       and supervised by the endoscopist. The following parameters were       monitored: oxygen saturation, heart rate, blood pressure, respiratory       rate, EKG, adequacy of pulmonary ventilation, and response to care.  Total physician intraservice time was 70 minutes. Recommendation:           - Patient has a contact number available for                            emergencies. The signs and symptoms of potential                            delayed complications were discussed with the                            patient. Return to normal activities tomorrow.                            Written discharge instructions were  provided to the                            patient.                           - Resume previous diet.                           - Continue present medications. Procedure Code(s):        --- Professional ---                           [phone], Esophagogastroduodenoscopy, flexible,                            transoral; with removal of foreign body(s)                           99152, Moderate sedation services provided by the                            same physician or other qualified health care                            professional performing the diagnostic or                            therapeutic service that the sedation supports,                            requiring the presence of an independent trained                            observer to assist in the monitoring of the                            patient's level of consciousness and physiological                            status; initial 15 minutes of intraservice time,  patient age 11 years or older                           [phone], Moderate sedation services; each additional                            15 minutes intraservice time                           [phone], Moderate sedation services; each additional                            15 minutes intraservice time                           99153, Moderate sedation services; each additional                            15 minutes intraservice time                           99153, Moderate sedation services; each additional                            15 minutes intraservice time Diagnosis Code(s):        --- Professional ---                           U98.1XBJ, Foreign body in stomach, initial encounter CPT copyright 2016 [employer]. All rights reserved. The codes documented in this report are preliminary and upon coder review may  be revised to meet current compliance requirements. Iva Boop, MD 05/02/2017 7:55:30 PM This report has been  signed electronically. Number of Addenda: 0

## 2017-05-02 NOTE — Discharge Instructions (Addendum)
° ° °  The wrench is out as you know. Good luck - no more tools or other objects in your mouth!  YOU HAD AN ENDOSCOPIC PROCEDURE TODAY: Refer to the procedure report and other information in the discharge instructions given to you for any specific questions about what was found during the examination. If this information does not answer your questions, please call Dr. Marvell FullerGessner's office at [phone] to clarify.   YOU SHOULD EXPECT: Some feelings of bloating in the abdomen. Passage of more gas than usual. Walking can help get rid of the air that was put into your GI tract during the procedure and reduce the bloating. If you had a lower endoscopy (such as a colonoscopy or flexible sigmoidoscopy) you may notice spotting of blood in your stool or on the toilet paper. Some abdominal soreness may be present for a day or two, also.  DIET: Your first meal following the procedure should be a light meal and then it is ok to progress to your normal diet. A half-sandwich or bowl of soup is an example of a good first meal. Heavy or fried foods are harder to digest and may make you feel nauseous or bloated. Drink plenty of fluids but you should avoid alcoholic beverages for 24 hours.   ACTIVITY: Your care partner should take you home directly after the procedure. You should plan to take it easy, moving slowly for the rest of the day. You can resume normal activity the day after the procedure however YOU SHOULD NOT DRIVE, use power tools, machinery or perform tasks that involve climbing or major physical exertion for 24 hours (because of the sedation medicines used during the test).   SYMPTOMS TO REPORT IMMEDIATELY: A gastroenterologist can be reached at any hour. Please call [phone]  for any of the following symptoms:   Following upper endoscopy (EGD, EUS, ERCP, esophageal dilation) Vomiting of blood or coffee ground material  New, significant abdominal pain  New, significant chest pain or pain under  the shoulder blades  Painful or persistently difficult swallowing  New shortness of breath  Black, tarry-looking or red, bloody stools

## 2017-05-02 NOTE — H&P (Signed)
  [facility] History and Physical   Primary Care Physician:  Dorcas CarrowJohnson, Megan P, DO   Reason for Procedure:  Removed gastric foreign body  Plan:    EGD and foreign body removal     HPI: Kevin Townsend is a 22 y.o. male - ingested a wrench accidentally today and it sits in stomach on xray. No pain.  Past Medical History:  Diagnosis Date  . ADHD (attention deficit hyperactivity disorder)   . Underweight     Past Surgical History:  Procedure Laterality Date  . WISDOM TOOTH EXTRACTION      Prior to Admission medications   Medication Sig Start Date End Date Taking? Authorizing Provider  amphetamine-dextroamphetamine (ADDERALL XR) 20 MG 24 hr capsule Take 1 capsule (20 mg total) by mouth daily. 04/15/17  Yes Johnson, Megan P, DO  amphetamine-dextroamphetamine (ADDERALL) 10 MG tablet 5-10mg  in the Afternoon as needed for classes 04/15/17  Yes Johnson, Megan P, DO    No current facility-administered medications for this encounter.    Current Outpatient Medications  Medication Sig Dispense Refill  . amphetamine-dextroamphetamine (ADDERALL XR) 20 MG 24 hr capsule Take 1 capsule (20 mg total) by mouth daily. 30 capsule 0  . amphetamine-dextroamphetamine (ADDERALL) 10 MG tablet 5-10mg  in the Afternoon as needed for classes 30 tablet 0    Allergies as of 05/02/2017  . (No Known Allergies)    History reviewed. No pertinent family history.    Review of Systems: All other review of systems negative except as mentioned in the HPI.  Physical Exam: Vital signs in last 24 hours: Temp:  [98.5 F (36.9 C)] 98.5 F (36.9 C) (03/28 1640) Pulse Rate:  [101] 101 (03/28 1640) Resp:  [20] 20 (03/28 1640) BP: (120)/(85) 120/85 (03/28 1640) SpO2:  [99 %] 99 % (03/28 1640)   General:   Alert,  Well-developed, well-nourished, pleasant and cooperative in NAD Lungs:  Clear throughout to auscultation.   Heart:  Regular rate and rhythm; no murmurs, clicks, rubs,  or  gallops. Abdomen:  Soft, nontender and nondistended. Normal bowel sounds.   Neuro/Psych:  Alert and cooperative. Normal mood and affect. A and O x 3   @Carl  Sena SlateE. Gessner, MD, [facility] [phone] (pager) 05/02/2017 5:54 PM@

## 2017-05-02 NOTE — ED Provider Notes (Signed)
[facility]-EMERGENCY DEPT Provider Note   CSN: [id number] Arrival date & time: 05/02/17  1630     History   Chief Complaint No chief complaint on file.   HPI Kevin Townsend is a 22 y.o. male.  22 yo M with a chief complaint of a swallowed foreign body.  The patient was working on a car and had a wrench in his mouth he then lost his balance and ended up swallowing the range.  He denies coughing or the fact that it got stuck.  He told me that it went down like water.  The patient was still hungry afterwards and then had some bagel bites.  He denies vomiting denies chest pain denies shortness of breath.  Denies abdominal pain.  This wrench was estimated to be about 4 inches in length.  He denies any other foreign bodies.  The history is provided by the patient.  Illness  This is a new problem. The current episode started less than 1 hour ago. The problem occurs constantly. The problem has not changed since onset.Pertinent negatives include no chest pain, no abdominal pain, no headaches and no shortness of breath. Nothing aggravates the symptoms. Nothing relieves the symptoms. He has tried nothing for the symptoms. The treatment provided no relief.    Past Medical History:  Diagnosis Date  . ADHD (attention deficit hyperactivity disorder)   . Underweight     Patient Active Problem List   Diagnosis Date Noted  . Foreign body of stomach, initial encounter   . Underweight 06/08/2016  . Iron deficiency 08/11/2014  . ADD (attention deficit disorder) 07/26/2014    Past Surgical History:  Procedure Laterality Date  . WISDOM TOOTH EXTRACTION          Home Medications    Prior to Admission medications   Medication Sig Start Date End Date Taking? Authorizing Provider  amphetamine-dextroamphetamine (ADDERALL XR) 20 MG 24 hr capsule Take 1 capsule (20 mg total) by mouth daily. 04/15/17  Yes Johnson, Megan P, DO  amphetamine-dextroamphetamine (ADDERALL) 10 MG  tablet 5-10mg  in the Afternoon as needed for classes 04/15/17  Yes Dorcas CarrowJohnson, Megan P, DO    Family History History reviewed. No pertinent family history.  Social History Social History   Tobacco Use  . Smoking status: Former Games developermoker  . Smokeless tobacco: Never Used  Substance Use Topics  . Alcohol use: No    Alcohol/week: 0.0 oz  . Drug use: No     Allergies   Patient has no known allergies.   Review of Systems Review of Systems  Constitutional: Negative for chills and fever.  HENT: Negative for congestion and facial swelling.   Eyes: Negative for discharge and visual disturbance.  Respiratory: Negative for shortness of breath.   Cardiovascular: Negative for chest pain and palpitations.  Gastrointestinal: Negative for abdominal pain, diarrhea and vomiting.  Musculoskeletal: Negative for arthralgias and myalgias.  Skin: Negative for color change and rash.  Neurological: Negative for tremors, syncope and headaches.  Psychiatric/Behavioral: Negative for confusion and dysphoric mood.     Physical Exam Updated Vital Signs BP (!) 128/93   Pulse (!) 102   Temp 98.5 F (36.9 C) (Oral)   Resp 17   Ht 6\' 3"  (1.905 m)   Wt 63.6 kg (140 lb 2 oz)   SpO2 100%   BMI 17.51 kg/m   Physical Exam  Constitutional: He is oriented to person, place, and time. He appears well-developed and well-nourished.  HENT:  Head: Normocephalic and  atraumatic.  Eyes: Pupils are equal, round, and reactive to light. EOM are normal.  Neck: Normal range of motion. Neck supple. No JVD present.  Cardiovascular: Normal rate and regular rhythm. Exam reveals no gallop and no friction rub.  No murmur heard. Pulmonary/Chest: No respiratory distress. He has no wheezes.  Abdominal: He exhibits no distension and no mass. There is no tenderness. There is no rebound and no guarding.  Musculoskeletal: Normal range of motion.  Neurological: He is alert and oriented to person, place, and time.  Skin: No rash  noted. No pallor.  Psychiatric: He has a normal mood and affect. His behavior is normal.  Nursing note and vitals reviewed.    ED Treatments / Results  Labs (all labs ordered are listed, but only abnormal results are displayed) Labs Reviewed - No data to display  EKG None  Radiology Dg Chest 1 View  Result Date: 05/02/2017 CLINICAL DATA:  Foreign body EXAM: CHEST  1 VIEW COMPARISON:  None. FINDINGS: Lung fields are clear. Heart size is normal. No pneumothorax. Partially visualized metallic wrench in the left upper quadrant IMPRESSION: No active disease. Partially visible metallic branch in the left upper quadrant Electronically Signed   By: Jasmine Pang M.D.   On: 05/02/2017 17:00   Dg Abdomen 1 View  Result Date: 05/02/2017 CLINICAL DATA:  Swallowed wrench EXAM: ABDOMEN - 1 VIEW COMPARISON:  None. FINDINGS: Metallic wrench projects over the left upper quadrant/stomach. Nonobstructed gas pattern with large stool in the colon. IMPRESSION: Metallic wrench projects over the stomach/left upper quadrant. Electronically Signed   By: Jasmine Pang M.D.   On: 05/02/2017 17:00   Dg Abd Portable 1 View  Result Date: 05/02/2017 CLINICAL DATA:  Foreign body EXAM: PORTABLE ABDOMEN - 1 VIEW COMPARISON:  05/02/2017 FINDINGS: The stomach is air distended. A metallic wrench projects over the body and distal aspect of the stomach. IMPRESSION: Metallic ranch projects over the mid to distal gastric region. Interval gaseous enlargement of the stomach Electronically Signed   By: Jasmine Pang M.D.   On: 05/02/2017 18:57    Procedures Procedures (including critical care time)  Medications Ordered in ED Medications - No data to display   Initial Impression / Assessment and Plan / ED Course  I have reviewed the triage vital signs and the nursing notes.  Pertinent labs & imaging results that were available during my care of the patient were reviewed by me and considered in my medical decision making  (see chart for details).     22 yo M with a chief complaint of a swallowed foreign body.  Happened about an hour ago.  Patient is well-appearing and nontoxic.  No abdominal tenderness.  Will obtain an x-ray of  chest and abdomen.  Will discuss with GI.  X-ray reviewed by me measured foreign body at least 10 cm in length.  Discussed with Dr. Concha Se he came in and took out the foreign body out endoscopically.  He is now awake and alert and tolerating p.o.  Discharge home.  8:18 PM:  I have discussed the diagnosis/risks/treatment options with the patient and family and believe the pt to be eligible for discharge home to follow-up with GI. We also discussed returning to the ED immediately if new or worsening sx occur. We discussed the sx which are most concerning (e.g., sudden worsening pain, fever, inability to tolerate by mouth) that necessitate immediate return. Medications administered to the patient during their visit and any new prescriptions provided to the  patient are listed below.  Medications given during this visit Medications - No data to display   The patient appears reasonably screen and/or stabilized for discharge and I doubt any other medical condition or other [facility] requiring further screening, evaluation, or treatment in the ED at this time prior to discharge.    Final Clinical Impressions(s) / ED Diagnoses   Final diagnoses:  Swallowed foreign body, initial encounter    ED Discharge Orders    None       Melene Plan, DO 05/02/17 2018

## 2017-05-02 NOTE — ED Notes (Signed)
Bed: RESA Expected date:  Expected time:  Means of arrival:  Comments: ENDO

## 2017-05-02 NOTE — ED Provider Notes (Signed)
Patient placed in Quick Look pathway, seen and evaluated   Chief Complaint: Swallowed foreign body  HPI:   Patient reports that he was in a recliner trying to adjust it while having a small 4 inch wrench in his mouth, when the chair tilted and he swallowed it.  ROS: No chest pain, shortness of breath, dysphasia, or abdominal discomfort.  Physical Exam:   Gen: No distress.  Neuro: Awake and Alert  Skin: Warm   Focused Exam: Oropharynx is clear moist.  Equal lung sounds in all fields.  Basic x-rays placed.  Advised that patient be taken back for next available room.   Initiation of care has begun. The patient has been counseled on the process, plan, and necessity for staying for the completion/evaluation, and the remainder of the medical screening examination    Delia ChimesMurray, Ryszard Socarras B, PA-C 05/02/17 1642    Benjiman CorePickering, Nathan, MD 05/02/17 [phone]

## 2017-05-02 NOTE — ED Triage Notes (Signed)
Pt was trying to fix a chair and and was holding a small wrench in his mouth. It slipped and he accidentally swallowed it. He has eaten since [place](thai food and bagel bites.) No pain or N/V/D at this time.

## 2017-05-03 ENCOUNTER — Encounter (HOSPITAL_COMMUNITY): Payer: Self-pay | Admitting: Internal Medicine

## 2017-05-09 ENCOUNTER — Telehealth: Payer: Self-pay | Admitting: Internal Medicine

## 2017-05-09 NOTE — Telephone Encounter (Signed)
Message left on patient cell phone to call me back  I did call house and speak to dad who is listed as contact - and explained that I wanted to speak to [employer] aboutthe posting of him swallowing the wrench that I removed from his stomach.  Explained I am concerned about this as a sign of behavior nthat is a threat to his health since he had said he had it in his mouth while working and video shows differently.  Father explained that there have been other instances of posting videos with driving fast and involving posting of a pellet gun after being suspended for firing a flare gun.  Dad asked me to call back if Weston Brassick does not return my call.

## 2017-05-14 ENCOUNTER — Encounter: Payer: Self-pay | Admitting: Internal Medicine

## 2017-05-14 NOTE — Telephone Encounter (Signed)
This encounter was created in error - please disregard.

## 2017-05-15 ENCOUNTER — Encounter: Payer: Self-pay | Admitting: Internal Medicine

## 2017-05-23 ENCOUNTER — Ambulatory Visit: Payer: BLUE CROSS/BLUE SHIELD | Admitting: Family Medicine

## 2017-06-05 ENCOUNTER — Other Ambulatory Visit: Payer: Self-pay | Admitting: Family Medicine

## 2017-06-05 NOTE — Telephone Encounter (Signed)
Copied from CRM #[id number]. Topic: Quick Communication - See Telephone Encounter >> Jun 05, 2017  2:52 PM Waymon Amato wrote: Pt is calling to get a refill on adderall 10 mg  [facility] [address] [place]  [phone]  Best number [phone]

## 2017-06-05 NOTE — Telephone Encounter (Signed)
Spoke with pt's father, Carlis Abbott regarding refill of medication. Prescription for Adderall  tab is already available at the pharmacy and can not be refilled until 06/14/17.Pt's father states he will clarify which medication is needing a refill and call the office back.

## 2017-06-05 NOTE — Telephone Encounter (Signed)
Request for refill of Adderall , last filled on 04/15/17 #30.   LOV: 04/15/17 Dr[facility] Benes  [facility] [address]

## 2017-06-05 NOTE — Addendum Note (Signed)
Addended by: Amado Coe on: 06/05/2017 06:03 PM   Modules accepted: Orders

## 2017-06-06 MED ORDER — AMPHETAMINE-DEXTROAMPHETAMINE 10 MG PO TABS: ORAL_TABLET | ORAL | 0 refills | Status: DC

## 2017-06-07 NOTE — Telephone Encounter (Signed)
Left message script ready to be picked up at office

## 2017-07-15 ENCOUNTER — Other Ambulatory Visit: Payer: Self-pay | Admitting: Family Medicine

## 2017-07-15 NOTE — Telephone Encounter (Signed)
Copied from CRM [phone]#[id number]. Topic: Quick Communication - Rx Refill/Question >> Jul 15, 2017  3:32 PM Oneal GroutSebastian, Jennifer S wrote: Medication: amphetamine-dextroamphetamine (ADDERALL XR) 20 MG 24 hr capsule   Has the patient contacted their pharmacy? Yes.   (Agent: If no, request that the patient contact the pharmacy for the refill.) (Agent: If yes, when and what did the pharmacy advise?)  [facility]erred Pharm[facility]with phone number [facility] [address]   Agent: Please be advised that RX refills may take up to 3 business days. We ask that you follow-up with your pharmacy.

## 2017-07-16 MED ORDER — AMPHETAMINE-DEXTROAMPHET ER 20 MG PO CP24: 20.0000 mg | ORAL_CAPSULE | Freq: Every day | ORAL | 0 refills | Status: DC

## 2017-07-16 NOTE — Telephone Encounter (Signed)
Requesting refill of Adderall  LOV 04/15/17  Dr. Laural [facility]esJohnson  [facility]  #20  0 refills  Pt was to return for F/U around 07/16/17. Called pt and scheduled appt for 08/21/17 per request. Pt asking for extended time between appts due to financial concerns.

## 2017-08-06 ENCOUNTER — Telehealth: Payer: Self-pay

## 2017-08-06 DIAGNOSIS — Z23 Encounter for immunization: Secondary | ICD-10-CM

## 2017-08-06 NOTE — Telephone Encounter (Signed)
Will order meningitis vaccine

## 2017-08-21 ENCOUNTER — Encounter: Payer: Self-pay | Admitting: Family Medicine

## 2017-08-21 ENCOUNTER — Ambulatory Visit (INDEPENDENT_AMBULATORY_CARE_PROVIDER_SITE_OTHER): Payer: BLUE CROSS/BLUE SHIELD | Admitting: Family Medicine

## 2017-08-21 VITALS — BP 108/72 | HR 98 | Ht 75.2 in | Wt 137.2 lb

## 2017-08-21 DIAGNOSIS — Z23 Encounter for immunization: Secondary | ICD-10-CM | POA: Diagnosis not present

## 2017-08-21 DIAGNOSIS — F988 Other specified behavioral and emotional disorders with onset usually occurring in childhood and adolescence: Secondary | ICD-10-CM

## 2017-08-21 MED ORDER — AMPHETAMINE-DEXTROAMPHETAMINE 20 MG PO TABS: 20.0000 mg | ORAL_TABLET | Freq: Two times a day (BID) | ORAL | 0 refills | Status: DC

## 2017-08-21 NOTE — Assessment & Plan Note (Signed)
Not doing great on the XR- will change back to short acting and recheck in 3 months. 3 month supply given. If during exam period, will give 1 month extra, but will need to be seen around Thanksgiving at the latest. Call with any concerns.

## 2017-08-21 NOTE — Patient Instructions (Signed)
Meningococcal ACWY Vaccines--MenACWY and MPSV4: What You Need to Know  1. Why get vaccinated?  Meningococcal disease is a serious illness caused by a type of bacteria called Neisseria meningitidis. It can lead to meningitis (infection of the lining of the brain and spinal cord) and infections of the blood. Meningococcal disease often occurs without warning--even among people who are otherwise healthy.  Meningococcal disease can spread from person to person through close contact (coughing or kissing) or lengthy contact, especially among people living in the same household.  There are at least 12 types of N. meningitidis, called "serogroups." Serogroups A, B, C, W, and Y cause most meningococcal disease.  Anyone can get meningococcal disease but certain people are at increased risk, including:   Infants younger than one year old   Adolescents and young adults 16 through 23 years old   People with certain medical conditions that affect the immune system   Microbiologists who routinely work with isolates of N. meningitidis   People at risk because of an outbreak in their community    Even when it is treated, meningococcal disease kills 10 to 15 infected people out of 100. And of those who survive, about 10 to 20 out of every 100 will suffer disabilities such as hearing loss, brain damage, kidney damage, amputations, nervous system problems, or severe scars from skin grafts.  Meningococcal ACWY vaccines can help prevent meningococcal disease caused by serogroups A, C, W, and Y. A different meningococcal vaccine is available to help protect against serogroup B.  2. Meningococcal ACWY vaccines  There are two kinds of meningococcal vaccines licensed by the Food and Drug Administration (FDA) for protection against serogroups A, C, W, and Y: meningococcal conjugate vaccine (MenACWY) and meningococcal polysaccharide vaccine (MPSV4).  Two doses of MenACWY are routinely recommended for adolescents 11 through 22 years old:  the first dose at 11 or 22 years old, with a booster dose at age 16. Some adolescents, including those with HIV, should get additional doses. Ask your health care provider for more information.  In addition to routine vaccination for adolescents, MenACWY vaccine is also recommended for certain groups of people:   People at risk because of a serogroup A, C, W, or Y meningococcal disease outbreak   Anyone whose spleen is damaged or has been removed   Anyone with a rare immune system condition called "persistent complement component deficiency"   Anyone taking a drug called eculizumab (also called Soliris)   Microbiologists who routinely work with isolates of N. meningitidis   Anyone traveling to, or living in, a part of the world where meningococcal disease is common, such as parts of [place]   College freshmen living in dormitories   [place] military recruits    Children between 2 and 23 months old, and people with certain medical conditions need multiple doses for adequate protection. Ask your health care provider about the number and timing of doses, and the need for booster doses.  MenACWY is the preferred vaccine for people in these groups who are 2 months through 22 years old, have received MenACWY previously, or anticipate requiring multiple doses.  MPSV4 is recommended for adults older than 55 who anticipate requiring only a single dose (travelers, or during community outbreaks).  3. Some people should not get this vaccine  Tell the person who is giving you the vaccine:   If you have any severe, life-threatening allergies.   If you have ever had a life-threatening allergic reaction after a previous dose   of meningococcal ACWY vaccine, or if you have a severe allergy to any part of this vaccine, you should not get this vaccine. Your provider can tell you about the vaccine's ingredients.   If you are pregnant or breastfeeding.   There is not very much information about the potential risks of this vaccine  for a pregnant woman or breastfeeding mother. It should be used during pregnancy only if clearly needed.    If you have a mild illness, such as a cold, you can probably get the vaccine today. If you are moderately or severely ill, you should probably wait until you recover. Your doctor can advise you.  4. Risks of a vaccine reaction  With any medicine, including vaccines, there is a chance of side effects. These are usually mild and go away on their own within a few days, but serious reactions are also possible.  As many as half of the people who get meningococcal ACWY vaccine have mild problems following vaccination, such as redness or soreness where the shot was given. If these problems occur, they usually last for 1 or 2 days. They are more common after MenACWY than after MPSV4.  A small percentage of people who receive the vaccine develop a mild fever.  Problems that could happen after any injected vaccine:   People sometimes faint after a medical procedure, including vaccination. Sitting or lying down for about 15 minutes can help prevent fainting, and injuries caused by a fall. Tell your doctor if you feel dizzy, or have vision changes or ringing in the ears.   Some people get severe pain in the shoulder and have difficulty moving the arm where a shot was given. This happens very rarely.   Any medication can cause a severe allergic reaction. Such reactions from a vaccine are very rare, estimated at about 1 in a million doses, and would happen within a few minutes to a few hours after the vaccination.  As with any medicine, there is a very remote chance of a vaccine causing a serious injury or death.  The safety of vaccines is always being monitored. For more information, visit: www.cdc.gov/vaccinesafety/  5. What if there is a serious reaction?  What should I look for?  Look for anything that concerns you, such as signs of a severe allergic reaction, very high fever, or unusual behavior.  Signs of a severe  allergic reaction can include hives, swelling of the face and throat, difficulty breathing, a fast heartbeat, dizziness, and weakness--usually within a few minutes to a few hours after the vaccination.  What should I do?   If you think it is a severe allergic reaction or other emergency that can't wait, call 9-1-1 and get to the nearest hospital. Otherwise, call your doctor.   Afterward, the reaction should be reported to the "Vaccine Adverse Event Reporting System" (VAERS). Your doctor should file this report, or you can do it yourself through the VAERS web site at www.vaers.hhs.gov, or by calling 1-800-822-7967.  ? VAERS does not give medical advice.  6. The National Vaccine Injury Compensation Program  The National Vaccine Injury Compensation Program (VICP) is a federal program that was created to compensate people who may have been injured by certain vaccines.  Persons who believe they may have been injured by a vaccine can learn about the program and about filing a claim by calling 1-800-338-2382 or visiting the VICP website at www.hrsa.gov/vaccinecompensation. There is a time limit to file a claim for compensation.  7.   How can I learn more?   Ask your health care provider. He or she can give you the vaccine package insert or suggest other sources of information.   Call your local or state health department.   Contact the Centers for Disease Control and Prevention (CDC):  ? Call 1-800-232-4636 (1-800-CDC-INFO) or  ? Visit CDC's website at www.cdc.gov/vaccines  Vaccine Information Statement, Meningococcal ACWY Vaccines (05/06/2014)  This information is not intended to replace advice given to you by your health care provider. Make sure you discuss any questions you have with your health care provider.  Document Released: 11/19/2005 Document Revised: 10/13/2015 Document Reviewed: 10/13/2015  Elsevier Interactive Patient Education  2017 [employer]

## 2017-08-21 NOTE — Progress Notes (Signed)
BP 108/72 (BP Location: Left Arm, Patient Position: Sitting, Cuff Size: Normal)   Pulse 98   Ht 6' 3.2" (1.91 m)   Wt 137 lb 3 oz (62.2 kg)   SpO2 99%   BMI 17.06 kg/m    Subjective:    Patient ID: Kevin Townsend, male    DOB: 03/16/1995, 22 y.o.   MRN: [id number]  HPI: Kevin Townsend is a 22 y.o. male  Chief Complaint  Patient presents with  . ADHD   ADHD FOLLOW UP- doesn't finding that the capsules last as long. Prefers the tablets. Would like to go back on the short acting adderall.  ADHD status: worse Satisfied with current therapy: no Medication compliance:  excellent compliance Controlled substance contract: yes Previous psychiatry evaluation: no Previous medications: yes    Taking meds on weekends/vacations: yes Work/school performance:  good Difficulty sustaining attention/completing tasks: yes Distracted by extraneous stimuli: yes Does not listen when spoken to: no  Fidgets with hands or feet: no Unable to stay in seat: no Blurts out/interrupts others: no ADHD Medication Side Effects: no    Decreased appetite: no    Headache: no    Sleeping disturbance pattern: no    Irritability: no    Rebound effects (worse than baseline) off medication: no    Anxiousness: no    Dizziness: no    Tics: no  Relevant past medical, surgical, family and social history reviewed and updated as indicated. Interim medical history since our last visit reviewed. Allergies and medications reviewed and updated.  Review of Systems  Constitutional: Negative.   Respiratory: Negative.   Cardiovascular: Negative.   Psychiatric/Behavioral: Positive for decreased concentration. Negative for agitation, behavioral problems, confusion, dysphoric mood, hallucinations, self-injury, sleep disturbance and suicidal ideas. The patient is not nervous/anxious and is not hyperactive.     Per HPI unless specifically indicated above     Objective:    BP 108/72 (BP Location: Left Arm, Patient  Position: Sitting, Cuff Size: Normal)   Pulse 98   Ht 6' 3.2" (1.91 m)   Wt 137 lb 3 oz (62.2 kg)   SpO2 99%   BMI 17.06 kg/m   Wt Readings from Last 3 Encounters:  08/21/17 137 lb 3 oz (62.2 kg)  05/02/17 140 lb 2 oz (63.6 kg)  04/15/17 140 lb 2 oz (63.6 kg)    Physical Exam  Constitutional: He is oriented to person, place, and time. He appears well-developed and well-nourished. No distress.  HENT:  Head: Normocephalic and atraumatic.  Right Ear: Hearing normal.  Left Ear: Hearing normal.  Nose: Nose normal.  Eyes: Conjunctivae and lids are normal. Right eye exhibits no discharge. Left eye exhibits no discharge. No scleral icterus.  Cardiovascular: Normal rate, regular rhythm, normal heart sounds and intact distal pulses. Exam reveals no gallop and no friction rub.  No murmur heard. Pulmonary/Chest: Effort normal and breath sounds normal. No stridor. No respiratory distress. He has no wheezes. He has no rales. He exhibits no tenderness.  Musculoskeletal: Normal range of motion.  Neurological: He is alert and oriented to person, place, and time.  Skin: Skin is warm, dry and intact. Capillary refill takes less than 2 seconds. No rash noted. He is not diaphoretic. No erythema. No pallor.  Psychiatric: He has a normal mood and affect. His speech is normal and behavior is normal. Judgment and thought content normal. Cognition and memory are normal.  Nursing note and vitals reviewed.   Results for orders placed or performed in visit  on 09/18/16  Rapid strep screen (not at [facility])  Result Value Ref Range   Strep Gp A Ag, IA W/Reflex Negative Negative  Culture, Group A Strep  Result Value Ref Range   Strep A Culture Negative       Assessment & Plan:   Problem List Items Addressed This Visit      Other   ADD (attention deficit disorder) - Primary    Not doing great on the XR- will change back to short acting and recheck in 3 months. 3 month supply given. If during exam period, will  give 1 month extra, but will need to be seen around Thanksgiving at the latest. Call with any concerns.        Other Visit Diagnoses    Immunization due       Meningitis vaccine given today.   Relevant Orders   Meningococcal conjugate vaccine 4-valent IM       Follow up plan: Return in about 3 months (around 11/21/2017) for follow up ADD/Physical.

## 2017-10-04 DIAGNOSIS — F902 Attention-deficit hyperactivity disorder, combined type: Secondary | ICD-10-CM | POA: Diagnosis not present

## 2017-10-04 DIAGNOSIS — F4320 Adjustment disorder, unspecified: Secondary | ICD-10-CM | POA: Diagnosis not present

## 2017-12-03 ENCOUNTER — Encounter: Payer: Self-pay | Admitting: Family Medicine

## 2017-12-03 ENCOUNTER — Ambulatory Visit (INDEPENDENT_AMBULATORY_CARE_PROVIDER_SITE_OTHER): Payer: BLUE CROSS/BLUE SHIELD | Admitting: Family Medicine

## 2017-12-03 VITALS — BP 116/75 | HR 67 | Wt 148.0 lb

## 2017-12-03 DIAGNOSIS — Z23 Encounter for immunization: Secondary | ICD-10-CM | POA: Diagnosis not present

## 2017-12-03 DIAGNOSIS — F988 Other specified behavioral and emotional disorders with onset usually occurring in childhood and adolescence: Secondary | ICD-10-CM | POA: Diagnosis not present

## 2017-12-03 MED ORDER — AMPHETAMINE-DEXTROAMPHETAMINE 20 MG PO TABS: 20.0000 mg | ORAL_TABLET | Freq: Two times a day (BID) | ORAL | 0 refills | Status: DC

## 2017-12-03 NOTE — Patient Instructions (Signed)

## 2017-12-03 NOTE — Progress Notes (Signed)
BP 116/75   Pulse 67   Wt 148 lb (67.1 kg)   SpO2 99%   BMI 18.40 kg/m    Subjective:    Patient ID: Kevin Townsend, male    DOB: 1995-06-09, 22 y.o.   MRN: [id number]  HPI: Kevin Townsend is a 22 y.o. male  Chief Complaint  Patient presents with  . ADHD   ADHD FOLLOW UP ADHD status: stable Satisfied with current therapy: yes Medication compliance:  excellent compliance Controlled substance contract: yes Previous psychiatry evaluation: yes Previous medications: yes    Taking meds on weekends/vacations: yes Work/school performance:  excellent Difficulty sustaining attention/completing tasks: no Distracted by extraneous stimuli: no Does not listen when spoken to: no  Fidgets with hands or feet: no Unable to stay in seat: no Blurts out/interrupts others: no ADHD Medication Side Effects: no    Decreased appetite: no    Headache: no    Sleeping disturbance pattern: no    Irritability: no    Rebound effects (worse than baseline) off medication: no    Anxiousness: no    Dizziness: no    Tics: no  Relevant past medical, surgical, family and social history reviewed and updated as indicated. Interim medical history since our last visit reviewed. Allergies and medications reviewed and updated.  Review of Systems  Constitutional: Negative.   Respiratory: Negative.   Cardiovascular: Negative.   Psychiatric/Behavioral: Negative.     Per HPI unless specifically indicated above     Objective:    BP 116/75   Pulse 67   Wt 148 lb (67.1 kg)   SpO2 99%   BMI 18.40 kg/m   Wt Readings from Last 3 Encounters:  12/03/17 148 lb (67.1 kg)  08/21/17 137 lb 3 oz (62.2 kg)  05/02/17 140 lb 2 oz (63.6 kg)    Physical Exam  Constitutional: He is oriented to person, place, and time. He appears well-developed and well-nourished. No distress.  HENT:  Head: Normocephalic and atraumatic.  Right Ear: Hearing normal.  Left Ear: Hearing normal.  Nose: Nose normal.  Eyes:  Conjunctivae and lids are normal. Right eye exhibits no discharge. Left eye exhibits no discharge. No scleral icterus.  Cardiovascular: Normal rate, regular rhythm, normal heart sounds and intact distal pulses. Exam reveals no gallop and no friction rub.  No murmur heard. Pulmonary/Chest: Effort normal and breath sounds normal. No stridor. No respiratory distress. He has no wheezes. He has no rales. He exhibits no tenderness.  Musculoskeletal: Normal range of motion.  Neurological: He is alert and oriented to person, place, and time.  Skin: Skin is warm, dry and intact. Capillary refill takes less than 2 seconds. No rash noted. He is not diaphoretic. No erythema. No pallor.  Psychiatric: He has a normal mood and affect. His speech is normal and behavior is normal. Judgment and thought content normal. Cognition and memory are normal.  Nursing note and vitals reviewed.   Results for orders placed or performed in visit on 09/18/16  Rapid strep screen (not at [facility])  Result Value Ref Range   Strep Gp A Ag, IA W/Reflex Negative Negative  Culture, Group A Strep  Result Value Ref Range   Strep A Culture Negative       Assessment & Plan:   Problem List Items Addressed This Visit      Other   ADD (attention deficit disorder) - Primary    Under good control on current regimen. Continue current regimen. Continue to monitor. Call with any  concerns. Refills given for 3 months. Follow up in 3 months.         Other Visit Diagnoses    Needs flu shot       Flu shot given today.   Relevant Orders   Flu Vaccine QUAD 6+ mos PF IM (Fluarix Quad PF) (Completed)       Follow up plan: Return in about 3 months (around 03/05/2018) for Physical/follow up ADD.

## 2017-12-03 NOTE — Assessment & Plan Note (Signed)
Under good control on current regimen. Continue current regimen. Continue to monitor. Call with any concerns. Refills given for 3 months. Follow up in 3 months.   

## 2017-12-20 DIAGNOSIS — F4322 Adjustment disorder with anxiety: Secondary | ICD-10-CM | POA: Diagnosis not present

## 2017-12-20 DIAGNOSIS — F902 Attention-deficit hyperactivity disorder, combined type: Secondary | ICD-10-CM | POA: Diagnosis not present

## 2017-12-25 DIAGNOSIS — M778 Other enthesopathies, not elsewhere classified: Secondary | ICD-10-CM | POA: Diagnosis not present

## 2017-12-25 DIAGNOSIS — M26631 Articular disc disorder of right temporomandibular joint: Secondary | ICD-10-CM | POA: Diagnosis not present

## 2017-12-25 DIAGNOSIS — M609 Myositis, unspecified: Secondary | ICD-10-CM | POA: Diagnosis not present

## 2018-01-01 DIAGNOSIS — M26631 Articular disc disorder of right temporomandibular joint: Secondary | ICD-10-CM | POA: Diagnosis not present

## 2018-01-01 DIAGNOSIS — M609 Myositis, unspecified: Secondary | ICD-10-CM | POA: Diagnosis not present

## 2018-01-01 DIAGNOSIS — M778 Other enthesopathies, not elsewhere classified: Secondary | ICD-10-CM | POA: Diagnosis not present

## 2018-01-20 ENCOUNTER — Encounter: Payer: BLUE CROSS/BLUE SHIELD | Admitting: Family Medicine

## 2018-01-21 DIAGNOSIS — J342 Deviated nasal septum: Secondary | ICD-10-CM | POA: Insufficient documentation

## 2018-01-21 DIAGNOSIS — Z681 Body mass index (BMI) 19 or less, adult: Secondary | ICD-10-CM | POA: Diagnosis not present

## 2018-02-04 DIAGNOSIS — M26631 Articular disc disorder of right temporomandibular joint: Secondary | ICD-10-CM | POA: Diagnosis not present

## 2018-02-04 DIAGNOSIS — M778 Other enthesopathies, not elsewhere classified: Secondary | ICD-10-CM | POA: Diagnosis not present

## 2018-02-04 DIAGNOSIS — M609 Myositis, unspecified: Secondary | ICD-10-CM | POA: Diagnosis not present

## 2018-03-04 NOTE — Progress Notes (Signed)
BP 128/79 (BP Location: Left Arm, Patient Position: Sitting, Cuff Size: Normal)   Pulse 74   Temp 98.4 F (36.9 C)   Ht 6' 3.2" (1.91 m)   Wt 156 lb 5 oz (70.9 kg)   SpO2 99%   BMI 19.43 kg/m    Subjective:    Patient ID: Kevin Townsend, male    DOB: [birth year], 23 y.o.   MRN: [id number]  HPI: Kevin Townsend is a 23 y.o. male  Chief Complaint  Patient presents with  . ADHD   ADHD FOLLOW UP ADHD status: stable Satisfied with current therapy: yes Medication compliance:  excellent compliance Controlled substance contract: no Previous psychiatry evaluation: yes Previous medications: yes    Taking meds on weekends/vacations: yes Work/school performance:  good Difficulty sustaining attention/completing tasks: no Distracted by extraneous stimuli: no Does not listen when spoken to: no  Fidgets with hands or feet: no Unable to stay in seat: no Blurts out/interrupts others: no ADHD Medication Side Effects: no    Decreased appetite: no    Headache: no    Sleeping disturbance pattern: no    Irritability: no    Rebound effects (worse than baseline) off medication: no    Anxiousness: no    Dizziness: no    Tics: no  Relevant past medical, surgical, family and social history reviewed and updated as indicated. Interim medical history since our last visit reviewed. Allergies and medications reviewed and updated.  Review of Systems  Constitutional: Negative.   Respiratory: Negative.   Cardiovascular: Negative.   Musculoskeletal: Negative.   Psychiatric/Behavioral: Negative.     Per HPI unless specifically indicated above     Objective:    BP 128/79 (BP Location: Left Arm, Patient Position: Sitting, Cuff Size: Normal)   Pulse 74   Temp 98.4 F (36.9 C)   Ht 6' 3.2" (1.91 m)   Wt 156 lb 5 oz (70.9 kg)   SpO2 99%   BMI 19.43 kg/m   Wt Readings from Last 3 Encounters:  03/05/18 156 lb 5 oz (70.9 kg)  12/03/17 148 lb (67.1 kg)  08/21/17 137 lb 3 oz (62.2 kg)      Physical Exam Vitals signs and nursing note reviewed.  Constitutional:      General: He is not in acute distress.    Appearance: Normal appearance. He is not ill-appearing, toxic-appearing or diaphoretic.  HENT:     Head: Normocephalic and atraumatic.     Right Ear: External ear normal.     Left Ear: External ear normal.     Nose: Nose normal.     Mouth/Throat:     Mouth: Mucous membranes are moist.     Pharynx: Oropharynx is clear.  Eyes:     General: No scleral icterus.       Right eye: No discharge.        Left eye: No discharge.     Extraocular Movements: Extraocular movements intact.     Conjunctiva/sclera: Conjunctivae normal.     Pupils: Pupils are equal, round, and reactive to light.  Neck:     Musculoskeletal: Normal range of motion and neck supple.  Cardiovascular:     Rate and Rhythm: Normal rate and regular rhythm.     Pulses: Normal pulses.     Heart sounds: Normal heart sounds. No murmur. No friction rub. No gallop.   Pulmonary:     Effort: Pulmonary effort is normal. No respiratory distress.     Breath sounds: Normal breath sounds. No  stridor. No wheezing, rhonchi or rales.  Chest:     Chest wall: No tenderness.  Musculoskeletal: Normal range of motion.  Skin:    General: Skin is warm and dry.     Capillary Refill: Capillary refill takes less than 2 seconds.     Coloration: Skin is not jaundiced or pale.     Findings: No bruising, erythema, lesion or rash.  Neurological:     General: No focal deficit present.     Mental Status: He is alert and oriented to person, place, and time. Mental status is at baseline.  Psychiatric:        Mood and Affect: Mood normal.        Behavior: Behavior normal.        Thought Content: Thought content normal.        Judgment: Judgment normal.     Results for orders placed or performed in visit on 09/18/16  Rapid strep screen (not at [facility])  Result Value Ref Range   Strep Gp A Ag, IA W/Reflex Negative Negative  Culture,  Group A Strep  Result Value Ref Range   Strep A Culture Negative       Assessment & Plan:   Problem List Items Addressed This Visit      Other   ADD (attention deficit disorder) - Primary    Doing well. Stable. Continue current regimen. 3 month supply given today. Continue to monitor.           Follow up plan: Return in about 3 months (around 06/04/2018) for Physical.

## 2018-03-05 ENCOUNTER — Encounter: Payer: Self-pay | Admitting: Family Medicine

## 2018-03-05 ENCOUNTER — Ambulatory Visit (INDEPENDENT_AMBULATORY_CARE_PROVIDER_SITE_OTHER): Payer: BLUE CROSS/BLUE SHIELD | Admitting: Family Medicine

## 2018-03-05 VITALS — BP 128/79 | HR 74 | Temp 98.4°F | Ht 75.2 in | Wt 156.3 lb

## 2018-03-05 DIAGNOSIS — F988 Other specified behavioral and emotional disorders with onset usually occurring in childhood and adolescence: Secondary | ICD-10-CM

## 2018-03-05 MED ORDER — AMPHETAMINE-DEXTROAMPHETAMINE 20 MG PO TABS: 20.0000 mg | ORAL_TABLET | Freq: Two times a day (BID) | ORAL | 0 refills | Status: DC

## 2018-03-05 NOTE — Assessment & Plan Note (Signed)
Doing well. Stable. Continue current regimen. 3 month supply given today. Continue to monitor.

## 2018-03-07 ENCOUNTER — Ambulatory Visit: Payer: BLUE CROSS/BLUE SHIELD | Admitting: Family Medicine

## 2018-06-03 ENCOUNTER — Encounter: Payer: BLUE CROSS/BLUE SHIELD | Admitting: Family Medicine

## 2018-06-25 ENCOUNTER — Telehealth: Payer: Self-pay | Admitting: Family Medicine

## 2018-06-25 NOTE — Telephone Encounter (Signed)
Spoke with patient. He states he has been off his ADD medication for a little over 2 months. He declines a physical appointment at this time.  He said if you really need to see him to discuss him stopping the med he can schedule an appointment but if not he wanted me to just let you know.  Thank you

## 2018-06-26 NOTE — Telephone Encounter (Signed)
Called pt to relay message from Dr. Laural Benes, no answer left voicemail to call back.

## 2018-06-26 NOTE — Telephone Encounter (Signed)
If he is off his medication, that is fine. Thanks.

## 2018-08-25 ENCOUNTER — Other Ambulatory Visit: Payer: Self-pay

## 2018-10-31 ENCOUNTER — Other Ambulatory Visit: Payer: Self-pay

## 2018-10-31 ENCOUNTER — Ambulatory Visit (INDEPENDENT_AMBULATORY_CARE_PROVIDER_SITE_OTHER): Payer: BLUE CROSS/BLUE SHIELD

## 2018-10-31 DIAGNOSIS — Z23 Encounter for immunization: Secondary | ICD-10-CM | POA: Diagnosis not present

## 2019-02-20 ENCOUNTER — Encounter: Payer: BLUE CROSS/BLUE SHIELD | Admitting: Family Medicine

## 2019-02-20 NOTE — Progress Notes (Deleted)
There were no vitals taken for this visit.   Subjective:    Patient ID: Kevin Townsend, male    DOB: [birth year], 24 y.o.   MRN: [id number]  HPI: Kevin Townsend is a 24 y.o. male presenting on 02/20/2019 for comprehensive medical examination. Current medical complaints include:{Blank single:19197::"none","***"}  He currently lives with: Interim Problems from his last visit: {Blank single:19197::"yes","no"}  Depression Screen done today and results listed below:  Depression screen [facility] 2/9 06/08/2016 08/19/2015  Decreased Interest 0 0  Down, Depressed, Hopeless 0 0  PHQ - 2 Score 0 0    The patient {has/does not have:19849} a history of falls. I {did/did not:19850} complete a risk assessment for falls. A plan of care for falls {was/was not:19852} documented.   Past Medical History:  Past Medical History:  Diagnosis Date  . ADHD (attention deficit hyperactivity disorder)   . Ingestion of foreign body - intentional wrench ingestion 05/02/2017  . Underweight     Surgical History:  Past Surgical History:  Procedure Laterality Date  . ESOPHAGOGASTRODUODENOSCOPY N/A 05/02/2017   Procedure: ESOPHAGOGASTRODUODENOSCOPY (EGD);  Surgeon: Iva Boop, MD;  Location: [facility];  Service: Endoscopy;  Laterality: N/A;  . WISDOM TOOTH EXTRACTION      Medications:  Current Outpatient Medications on File Prior to Visit  Medication Sig  . amphetamine-dextroamphetamine (ADDERALL) 20 MG tablet Take 1 tablet (20 mg total) by mouth 2 (two) times daily for 30 days.  Marland Kitchen amphetamine-dextroamphetamine (ADDERALL) 20 MG tablet Take 1 tablet (20 mg total) by mouth 2 (two) times daily for 30 days.  Marland Kitchen amphetamine-dextroamphetamine (ADDERALL) 20 MG tablet Take 1 tablet (20 mg total) by mouth 2 (two) times daily for 30 days.   No current facility-administered medications on file prior to visit.    Allergies:  No Known Allergies  Social History:  Social History   Socioeconomic History  . Marital  status: Single    Spouse name: Not on file  . Number of children: 0  . Years of education: Not on file  . Highest education level: Not on file  Occupational History  . Occupation: Consulting civil engineer  Tobacco Use  . Smoking status: Former Games developer  . Smokeless tobacco: Never Used  Substance and Sexual Activity  . Alcohol use: No    Alcohol/week: 0.0 standard drinks  . Drug use: No  . Sexual activity: Not Currently  Other Topics Concern  . Not on file  Social History Narrative  . Not on file   Social Determinants of Health   Financial Resource Strain:   . Difficulty of Paying Living Expenses: Not on file  Food Insecurity:   . Worried About Programme researcher, broadcasting/film/video in the Last Year: Not on file  . Ran Out of Food in the Last Year: Not on file  Transportation Needs:   . Lack of Transportation (Medical): Not on file  . Lack of Transportation (Non-Medical): Not on file  Physical Activity:   . Days of Exercise per Week: Not on file  . Minutes of Exercise per Session: Not on file  Stress:   . Feeling of Stress : Not on file  Social Connections:   . Frequency of Communication with Friends and Family: Not on file  . Frequency of Social Gatherings with Friends and Family: Not on file  . Attends Religious Services: Not on file  . Active Member of Clubs or Organizations: Not on file  . Attends Banker Meetings: Not on file  . Marital Status: Not  on file  Intimate Partner Violence:   . Fear of Current or Ex-Partner: Not on file  . Emotionally Abused: Not on file  . Physically Abused: Not on file  . Sexually Abused: Not on file   Social History   Tobacco Use  Smoking Status Former Smoker  Smokeless Tobacco Never Used   Social History   Substance and Sexual Activity  Alcohol Use No  . Alcohol/week: 0.0 standard drinks    Family History:  No family history on file.  Past medical history, surgical history, medications, allergies, family history and social history reviewed  with patient today and changes made to appropriate areas of the chart.   Review of Systems - {ros master:310782} All other ROS negative except what is listed above and in the HPI.      Objective:    There were no vitals taken for this visit.  Wt Readings from Last 3 Encounters:  03/05/18 156 lb 5 oz (70.9 kg)  12/03/17 148 lb (67.1 kg)  08/21/17 137 lb 3 oz (62.2 kg)    Physical Exam  Results for orders placed or performed in visit on 09/18/16  Rapid strep screen (not at [facility])   Specimen: Other   OTHER  Result Value Ref Range   Strep Gp A Ag, IA W/Reflex Negative Negative  Culture, Group A Strep   OTHER  Result Value Ref Range   Strep A Culture Negative       Assessment & Plan:   Problem List Items Addressed This Visit    None    Visit Diagnoses    Routine general medical examination at a health care facility    -  Primary       Discussed aspirin prophylaxis for myocardial infarction prevention and decision was {Blank single:19197::"it was not indicated","made to continue ASA","made to start ASA","made to stop ASA","that we recommended ASA, and patient refused"}  LABORATORY TESTING:  Health maintenance labs ordered today as discussed above.   The natural history of prostate cancer and ongoing controversy regarding screening and potential treatment outcomes of prostate cancer has been discussed with the patient. The meaning of a false positive PSA and a false negative PSA has been discussed. He indicates understanding of the limitations of this screening test and wishes *** to proceed with screening PSA testing.   IMMUNIZATIONS:   - Tdap: Tetanus vaccination status reviewed: {tetanus status:315746}. - Influenza: {Blank single:19197::"Up to date","Administered today","Postponed to flu season","Refused","Given elsewhere"} - Pneumovax: {Blank single:19197::"Up to date","Administered today","Not applicable","Refused","Given elsewhere"} - Prevnar: {Blank single:19197::"Up  to date","Administered today","Not applicable","Refused","Given elsewhere"} - HPV: {Blank single:19197::"Up to date","Administered today","Not applicable","Refused","Given elsewhere"} - Zostavax vaccine: {Blank single:19197::"Up to date","Administered today","Not applicable","Refused","Given elsewhere"}  SCREENING: - Colonoscopy: {Blank single:19197::"Up to date","Ordered today","Not applicable","Refused","Done elsewhere"}  Discussed with patient purpose of the colonoscopy is to detect colon cancer at curable precancerous or early stages   - AAA Screening: {Blank single:19197::"Up to date","Ordered today","Not applicable","Refused","Done elsewhere"}  -Hearing Test: {Blank single:19197::"Up to date","Ordered today","Not applicable","Refused","Done elsewhere"}  -Spirometry: {Blank single:19197::"Up to date","Ordered today","Not applicable","Refused","Done elsewhere"}   PATIENT COUNSELING:    Sexuality: Discussed sexually transmitted diseases, partner selection, use of condoms, avoidance of unintended pregnancy  and contraceptive alternatives.   Advised to avoid cigarette smoking.  I discussed with the patient that most people either abstain from alcohol or drink within safe limits (<=14/week and <=4 drinks/occasion for males, <=7/weeks and <= 3 drinks/occasion for females) and that the risk for alcohol disorders and other health effects rises proportionally with the number of  drinks per week and how often a drinker exceeds daily limits.  Discussed cessation/primary prevention of drug use and availability of treatment for abuse.   Diet: Encouraged to adjust caloric intake to maintain  or achieve ideal body weight, to reduce intake of dietary saturated fat and total fat, to limit sodium intake by avoiding high sodium foods and not adding table salt, and to maintain adequate dietary potassium and calcium preferably from fresh fruits, vegetables, and low-fat dairy products.    stressed the  importance of regular exercise  Injury prevention: Discussed safety belts, safety helmets, smoke detector, smoking near bedding or upholstery.   Dental health: Discussed importance of regular tooth brushing, flossing, and dental visits.   Follow up plan: NEXT PREVENTATIVE PHYSICAL DUE IN 1 YEAR. No follow-ups on file.

## 2019-03-02 ENCOUNTER — Encounter: Payer: BLUE CROSS/BLUE SHIELD | Admitting: Family Medicine

## 2019-04-01 DIAGNOSIS — F902 Attention-deficit hyperactivity disorder, combined type: Secondary | ICD-10-CM | POA: Diagnosis not present

## 2019-04-01 DIAGNOSIS — F4322 Adjustment disorder with anxiety: Secondary | ICD-10-CM | POA: Diagnosis not present

## 2019-07-04 MED FILL — FLUoxetine HCL 40 MG CAPS: 40 | 30 days supply | Qty: 30 | Fill #0

## 2019-07-27 MED FILL — traZODone HCL 50 MG TABS: 50 | 30 days supply | Qty: 30 | Fill #0

## 2019-08-10 MED FILL — traZODone HCL 50 MG TABS: 50 | 30 days supply | Qty: 30 | Fill #0

## 2019-08-19 ENCOUNTER — Telehealth (HOSPITAL_COMMUNITY): Payer: Self-pay | Admitting: Psychiatry

## 2019-08-19 NOTE — Telephone Encounter (Signed)
D:  Patient's mother phoned inquiring about group for her son.  A:  Answered all her questions.  Mother will discuss with son this evening and call case manager back.

## 2019-08-20 DIAGNOSIS — F339 Major depressive disorder, recurrent, unspecified: Secondary | ICD-10-CM | POA: Diagnosis not present

## 2019-08-20 DIAGNOSIS — F17200 Nicotine dependence, unspecified, uncomplicated: Secondary | ICD-10-CM | POA: Diagnosis not present

## 2019-08-20 DIAGNOSIS — F411 Generalized anxiety disorder: Secondary | ICD-10-CM | POA: Diagnosis not present

## 2019-08-20 DIAGNOSIS — G4700 Insomnia, unspecified: Secondary | ICD-10-CM | POA: Diagnosis not present

## 2019-08-20 DIAGNOSIS — L089 Local infection of the skin and subcutaneous tissue, unspecified: Secondary | ICD-10-CM | POA: Diagnosis not present

## 2019-08-20 DIAGNOSIS — F332 Major depressive disorder, recurrent severe without psychotic features: Secondary | ICD-10-CM | POA: Diagnosis not present

## 2019-08-20 DIAGNOSIS — R45851 Suicidal ideations: Secondary | ICD-10-CM | POA: Diagnosis not present

## 2019-08-20 DIAGNOSIS — F1020 Alcohol dependence, uncomplicated: Secondary | ICD-10-CM | POA: Diagnosis not present

## 2019-09-02 DIAGNOSIS — Z23 Encounter for immunization: Secondary | ICD-10-CM | POA: Diagnosis not present

## 2019-09-18 MED FILL — traZODone HCL 50 MG TABS: 50 | 30 days supply | Qty: 30 | Fill #1

## 2019-10-27 DIAGNOSIS — F4322 Adjustment disorder with anxiety: Secondary | ICD-10-CM | POA: Diagnosis not present

## 2019-10-27 DIAGNOSIS — F902 Attention-deficit hyperactivity disorder, combined type: Secondary | ICD-10-CM | POA: Diagnosis not present

## 2019-10-27 MED FILL — ADDERALL XR 10 MG CAP SA: 10 | 30 days supply | Qty: 30 | Fill #0

## 2019-10-27 MED FILL — NALTREXONE 50 MG TABLET: 50 | 30 days supply | Qty: 15 | Fill #0

## 2019-12-09 IMAGING — DX DG ABD PORTABLE 1V
1 series · 1 of 1 positions shown · non-contrast
Comparison: 05/02/2017

CLINICAL DATA: Foreign body

EXAM:
PORTABLE ABDOMEN - 1 VIEW

[abdomen kub]
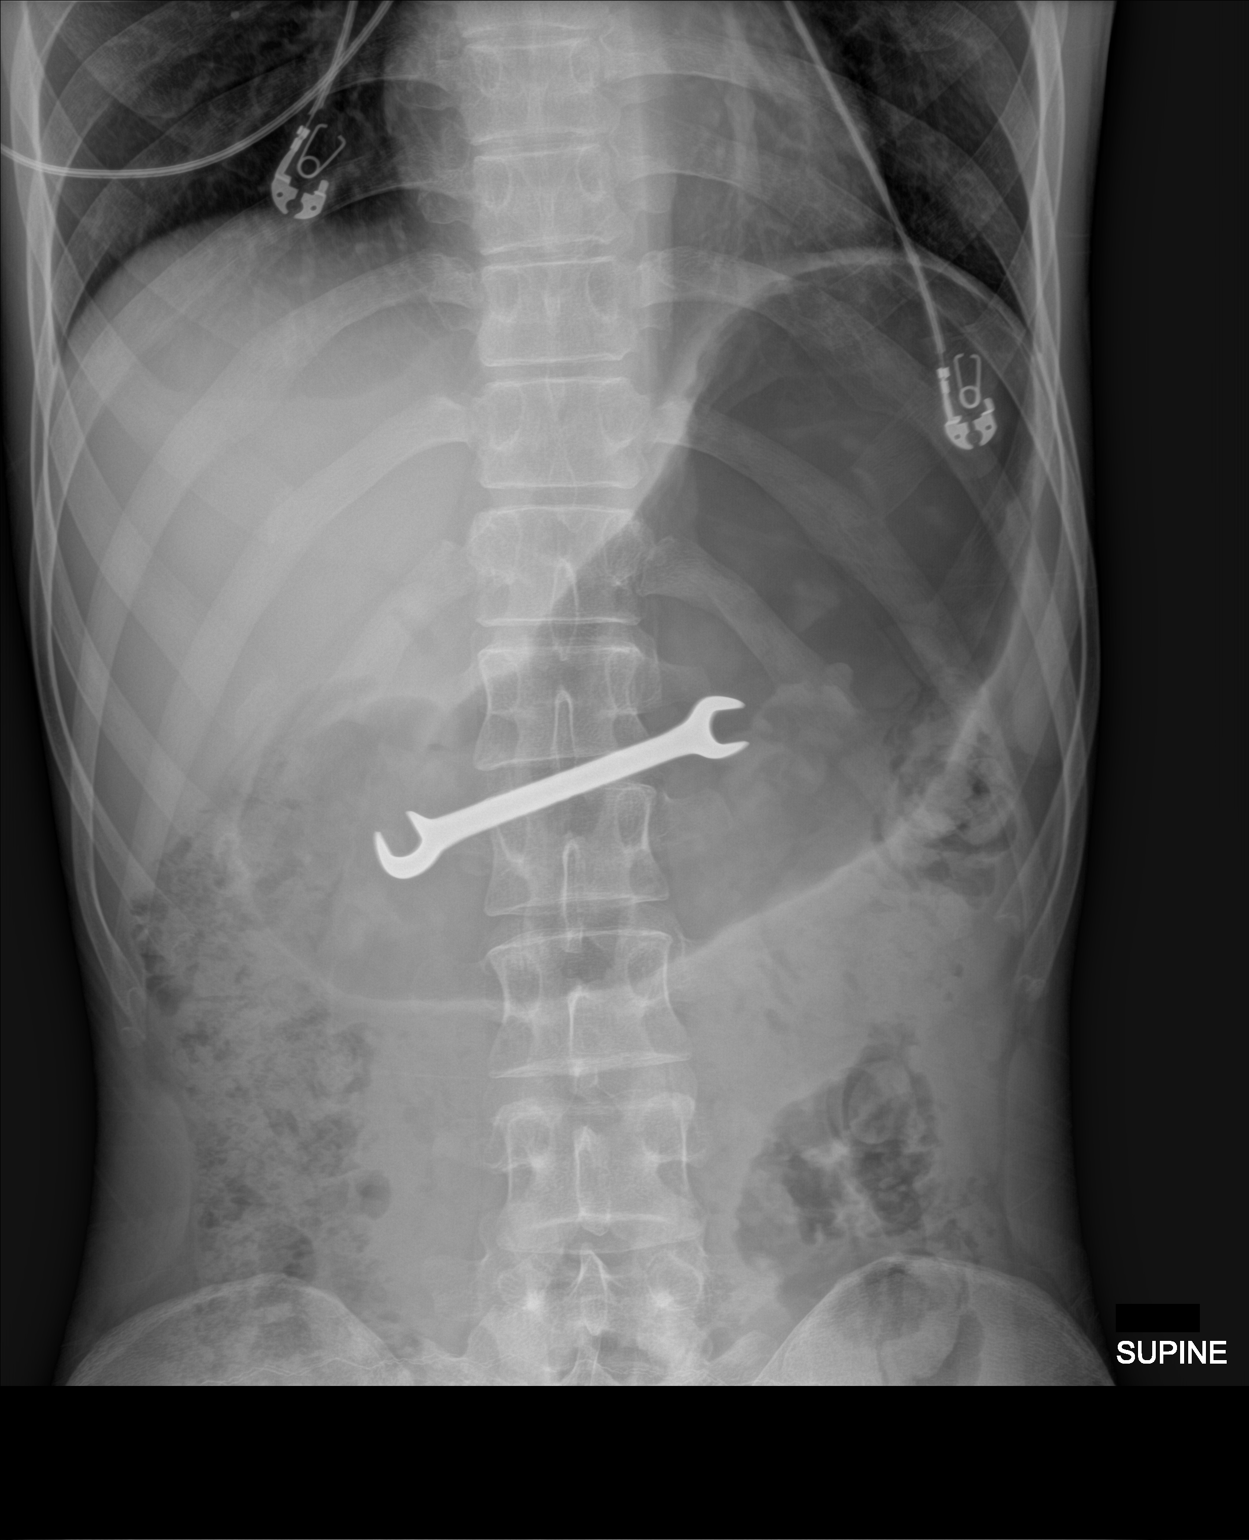

[1 of 1 positions shown; findings below may reference images not displayed]

FINDINGS: The stomach is air distended. A metallic wrench projects over the
body and distal aspect of the stomach.
IMPRESSION: Metallic Otripto projects over the mid to distal gastric region.
Interval gaseous enlargement of the stomach

## 2019-12-09 IMAGING — CR DG CHEST 1V
2 series · 2 of 2 positions shown · non-contrast
Comparison: None.

CLINICAL DATA: Foreign body

EXAM:
CHEST  1 VIEW

[t chest supine (1 of 2)]
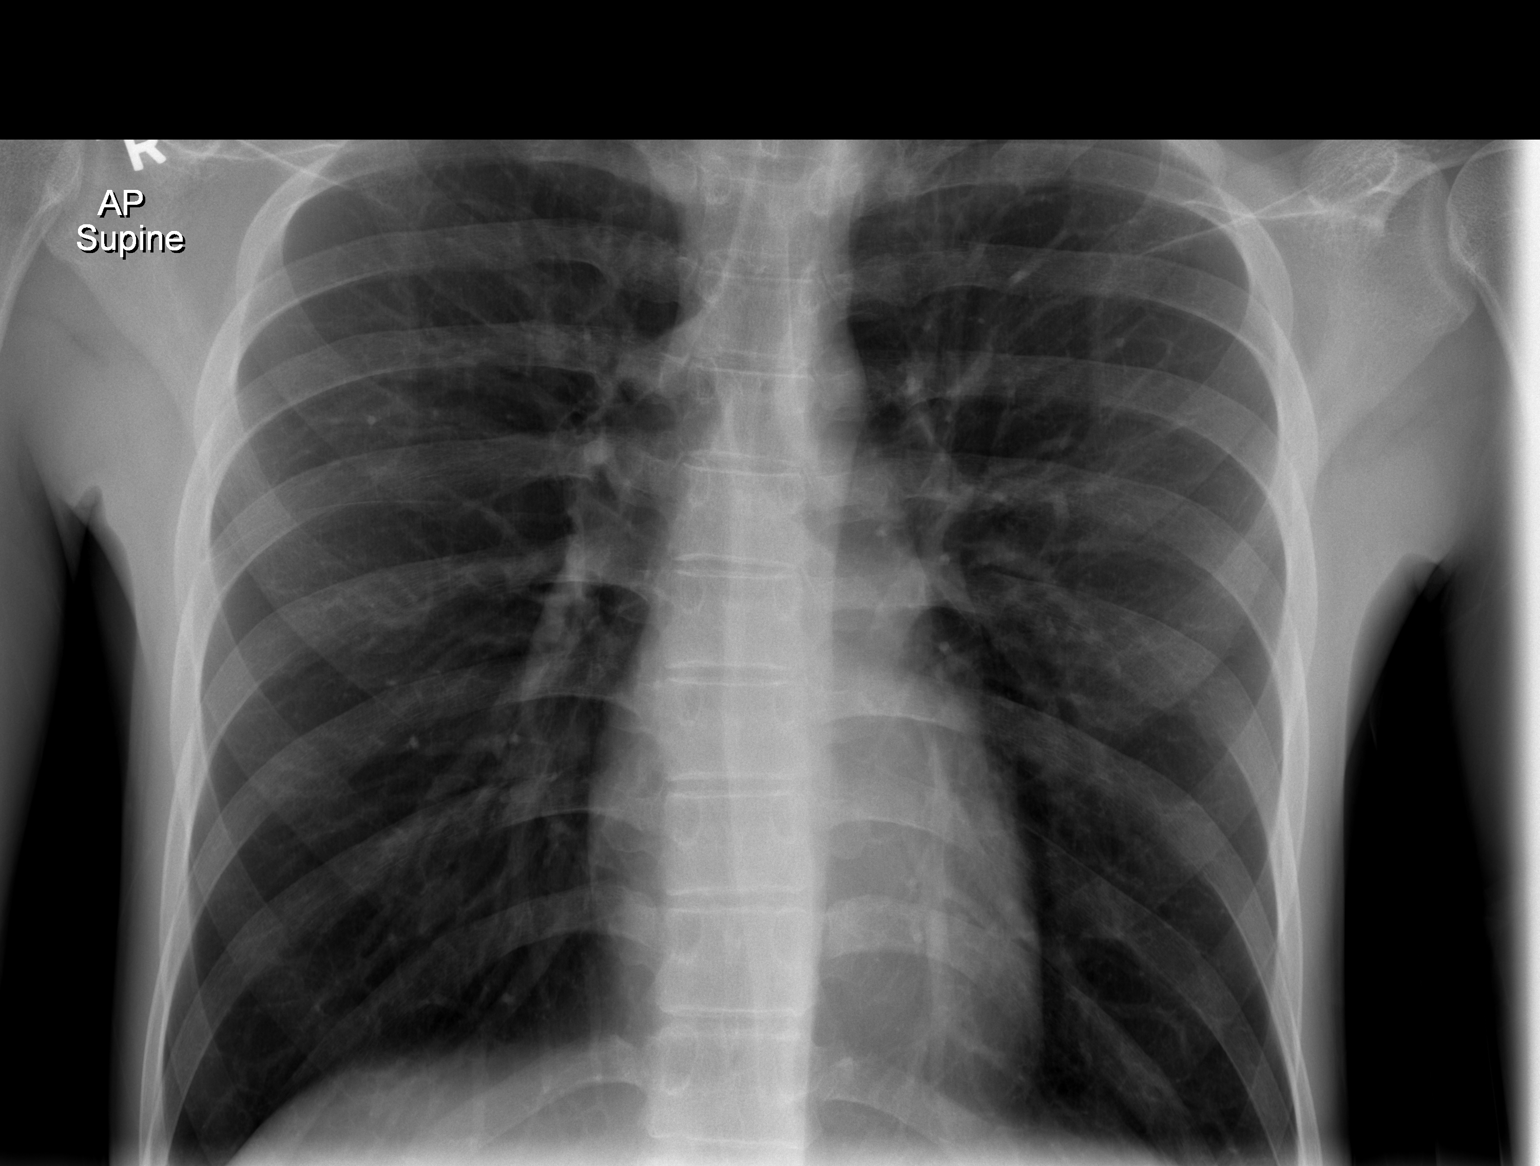

[t chest supine (2 of 2)]
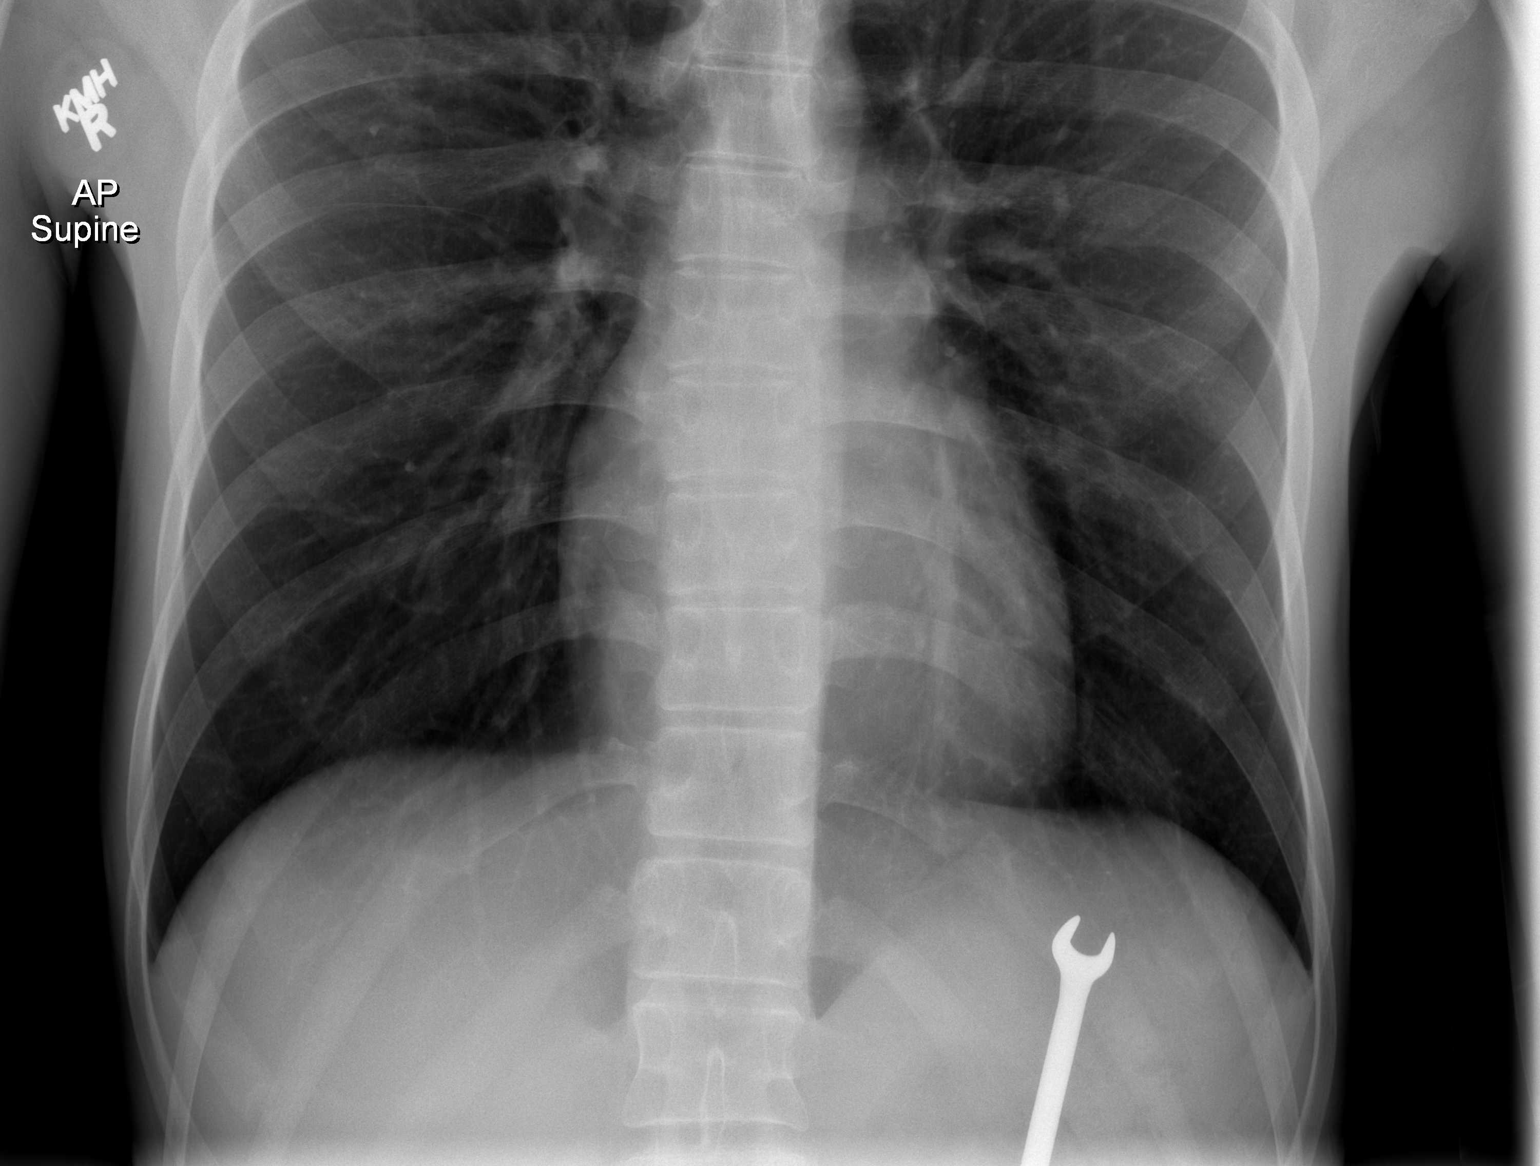

[2 of 2 positions shown; findings below may reference images not displayed]

FINDINGS: Lung fields are clear. Heart size is normal. No pneumothorax.
Partially visualized metallic wrench in the left upper quadrant
IMPRESSION: No active disease. Partially visible metallic branch in the left
upper quadrant

## 2019-12-09 IMAGING — CR DG ABDOMEN 1V
1 series · 1 of 1 positions shown · non-contrast
Comparison: None.

CLINICAL DATA: Swallowed wrench

EXAM:
ABDOMEN - 1 VIEW

[t abdomen supine]
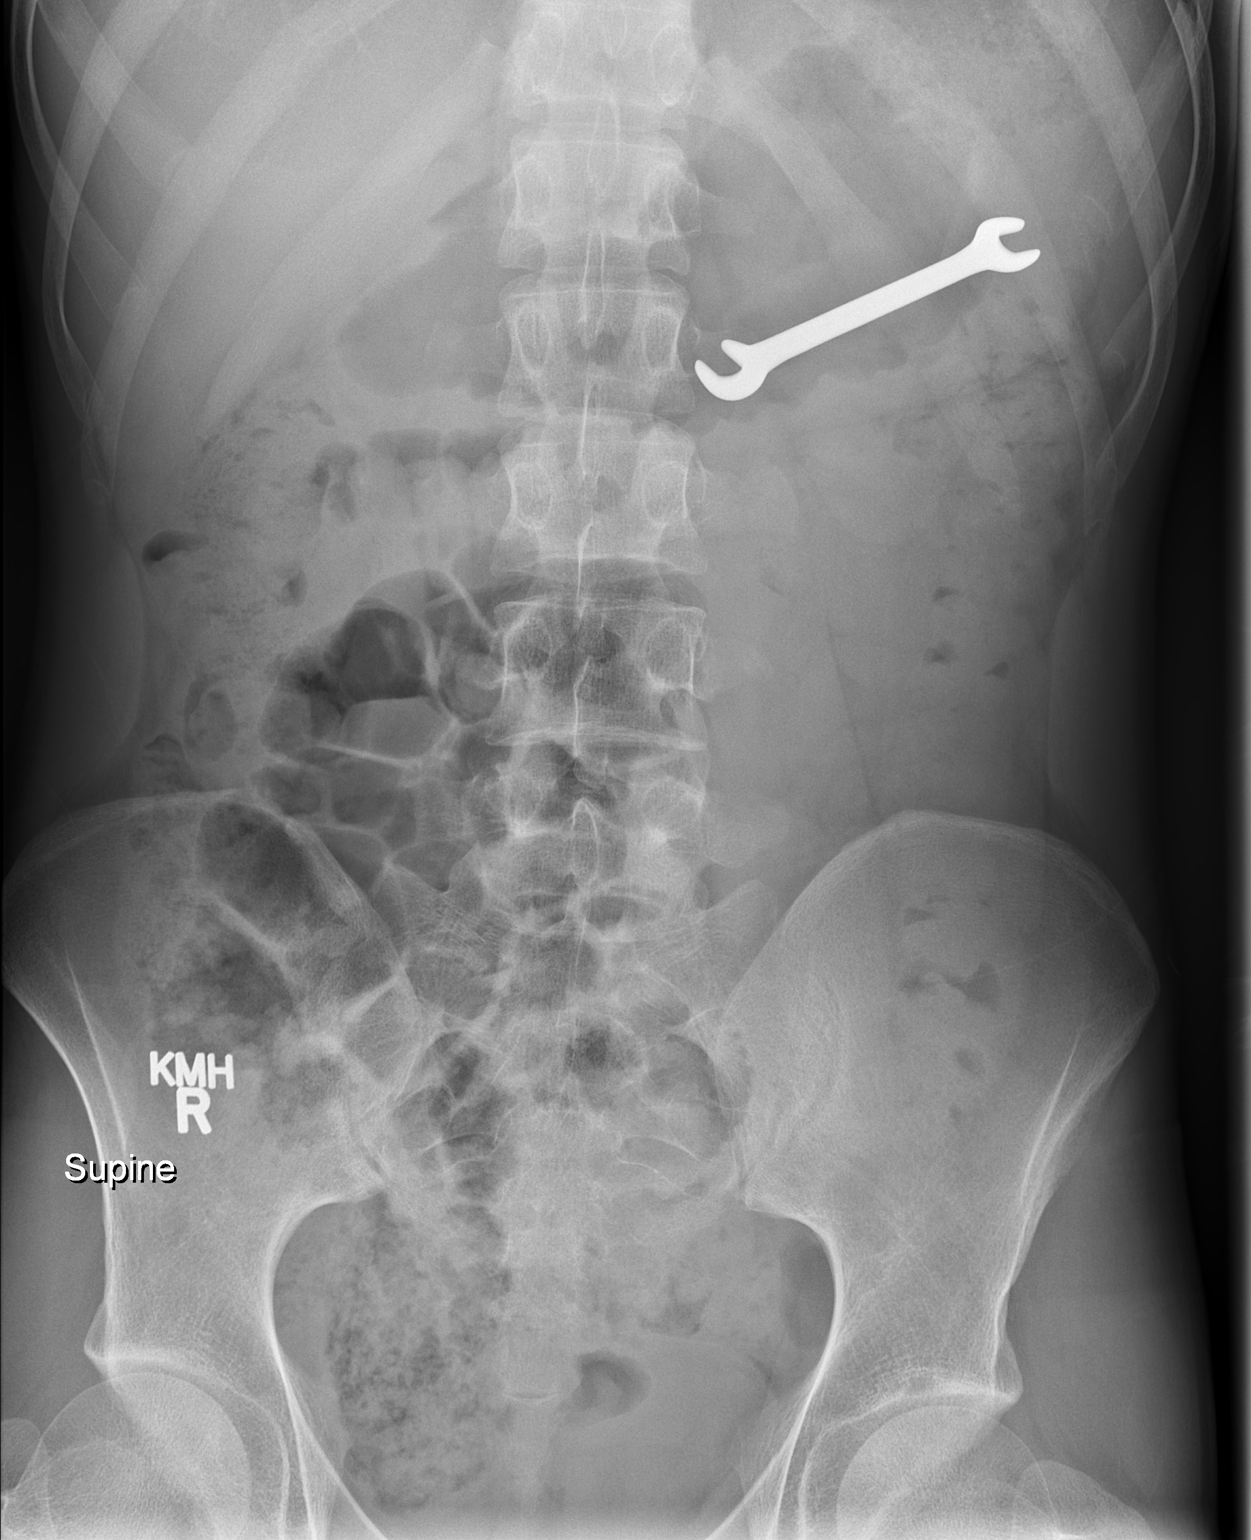

[1 of 1 positions shown; findings below may reference images not displayed]

FINDINGS: Metallic wrench projects over the left upper quadrant/stomach.
Nonobstructed gas pattern with large stool in the colon.
IMPRESSION: Metallic wrench projects over the stomach/left upper quadrant.

## 2020-02-02 ENCOUNTER — Encounter: Payer: BLUE CROSS/BLUE SHIELD | Admitting: Family Medicine

## 2020-04-11 ENCOUNTER — Other Ambulatory Visit: Payer: Self-pay

## 2020-04-11 ENCOUNTER — Ambulatory Visit (INDEPENDENT_AMBULATORY_CARE_PROVIDER_SITE_OTHER): Payer: BC Managed Care – PPO | Admitting: Family Medicine

## 2020-04-11 ENCOUNTER — Encounter: Payer: Self-pay | Admitting: Family Medicine

## 2020-04-11 DIAGNOSIS — F988 Other specified behavioral and emotional disorders with onset usually occurring in childhood and adolescence: Secondary | ICD-10-CM | POA: Diagnosis not present

## 2020-04-11 MED ORDER — AMPHETAMINE-DEXTROAMPHETAMINE 10 MG PO TABS: 10.0000 mg | ORAL_TABLET | Freq: Two times a day (BID) | ORAL | 0 refills | Status: DC

## 2020-04-11 NOTE — Assessment & Plan Note (Signed)
Would like to restart meds. Does better on short acting than long. Will restart 10mg  adderall BID and recheck 1 month. Call with any concerns. Has had issues with underweight- will monitor weight closely with restarting meds.

## 2020-04-11 NOTE — Progress Notes (Signed)
BP 135/88   Pulse 81   Temp 97.8 F (36.6 C)   Ht 6\' 4"  (1.93 m)   Wt 185 lb 9.6 oz (84.2 kg)   SpO2 98%   BMI 22.59 kg/m    Subjective:    Patient ID: , male    DOB: Apr 27, 1995, 25 y.o.   MRN: [id number]  HPI: Kevin Townsend is a 25 y.o. male  Chief Complaint  Patient presents with  . ADHD   ADHD FOLLOW UP- has been off meds for about 2 years, saw psychiatry back about 6 months ago and was put on 10mg  adderall xr and didn't like the meds, so didn't go back to see him. He has been working a new job and going to school for 25. He's working 12-13hours days.  ADHD status: uncontrolled Satisfied with current therapy: no Medication compliance:  poor compliance Controlled substance contract: no Previous psychiatry evaluation: yes Previous medications: yes adderall and adderall XR, vyvanse   Taking meds on weekends/vacations: has been off his meds Work/school performance:  average Difficulty sustaining attention/completing tasks: yes Distracted by extraneous stimuli: yes Does not listen when spoken to: no  Fidgets with hands or feet: no Unable to stay in seat: no Blurts out/interrupts others: no ADHD Medication Side Effects: yes    Decreased appetite: yes    Headache: no    Sleeping disturbance pattern: no    Irritability: no    Rebound effects (worse than baseline) off medication: no    Anxiousness: no    Dizziness: no    Tics: no  Relevant past medical, surgical, family and social history reviewed and updated as indicated. Interim medical history since our last visit reviewed. Allergies and medications reviewed and updated.  Review of Systems  Constitutional: Negative.   Respiratory: Negative.   Cardiovascular: Negative.   Musculoskeletal: Negative.   Psychiatric/Behavioral: Positive for decreased concentration. Negative for agitation, behavioral problems, confusion, dysphoric mood, hallucinations, self-injury, sleep disturbance and  suicidal ideas. The patient is nervous/anxious. The patient is not hyperactive.     Per HPI unless specifically indicated above     Objective:    BP 135/88   Pulse 81   Temp 97.8 F (36.6 C)   Ht 6\' 4"  (1.93 m)   Wt 185 lb 9.6 oz (84.2 kg)   SpO2 98%   BMI 22.59 kg/m   Wt Readings from Last 3 Encounters:  04/11/20 185 lb 9.6 oz (84.2 kg)  03/05/18 156 lb 5 oz (70.9 kg)  12/03/17 148 lb (67.1 kg)    Physical Exam Vitals and nursing note reviewed.  Constitutional:      General: He is not in acute distress.    Appearance: Normal appearance. He is not ill-appearing, toxic-appearing or diaphoretic.  HENT:     Head: Normocephalic and atraumatic.     Right Ear: External ear normal.     Left Ear: External ear normal.     Nose: Nose normal.     Mouth/Throat:     Mouth: Mucous membranes are moist.     Pharynx: Oropharynx is clear.  Eyes:     General: No scleral icterus.       Right eye: No discharge.        Left eye: No discharge.     Extraocular Movements: Extraocular movements intact.     Conjunctiva/sclera: Conjunctivae normal.     Pupils: Pupils are equal, round, and reactive to light.  Cardiovascular:     Rate and Rhythm: Normal  rate and regular rhythm.     Pulses: Normal pulses.     Heart sounds: Normal heart sounds. No murmur heard. No friction rub. No gallop.   Pulmonary:     Effort: Pulmonary effort is normal. No respiratory distress.     Breath sounds: Normal breath sounds. No stridor. No wheezing, rhonchi or rales.  Chest:     Chest wall: No tenderness.  Musculoskeletal:        General: Normal range of motion.     Cervical back: Normal range of motion and neck supple.  Skin:    General: Skin is warm and dry.     Capillary Refill: Capillary refill takes less than 2 seconds.     Coloration: Skin is not jaundiced or pale.     Findings: No bruising, erythema, lesion or rash.  Neurological:     General: No focal deficit present.     Mental Status: He is alert  and oriented to person, place, and time. Mental status is at baseline.  Psychiatric:        Mood and Affect: Mood normal.        Behavior: Behavior normal.        Thought Content: Thought content normal.        Judgment: Judgment normal.     Results for orders placed or performed in visit on 09/18/16  Rapid strep screen (not at [facility])   Specimen: Other   OTHER  Result Value Ref Range   Strep Gp A Ag, IA W/Reflex Negative Negative  Culture, Group A Strep   OTHER  Result Value Ref Range   Strep A Culture Negative       Assessment & Plan:   Problem List Items Addressed This Visit      Other   ADD (attention deficit disorder)    Would like to restart meds. Does better on short acting than long. Will restart 10mg  adderall BID and recheck 1 month. Call with any concerns. Has had issues with underweight- will monitor weight closely with restarting meds.           Follow up plan: Return in about 4 weeks (around 05/09/2020).

## 2020-05-13 ENCOUNTER — Encounter: Payer: Self-pay | Admitting: Family Medicine

## 2020-05-13 ENCOUNTER — Other Ambulatory Visit: Payer: Self-pay

## 2020-05-13 ENCOUNTER — Ambulatory Visit (INDEPENDENT_AMBULATORY_CARE_PROVIDER_SITE_OTHER): Payer: BC Managed Care – PPO | Admitting: Family Medicine

## 2020-05-13 DIAGNOSIS — F988 Other specified behavioral and emotional disorders with onset usually occurring in childhood and adolescence: Secondary | ICD-10-CM | POA: Diagnosis not present

## 2020-05-13 MED ORDER — AMPHETAMINE-DEXTROAMPHETAMINE 20 MG PO TABS: 20.0000 mg | ORAL_TABLET | Freq: Two times a day (BID) | ORAL | 0 refills | Status: DC

## 2020-05-13 NOTE — Assessment & Plan Note (Signed)
Meds not lasting long enough. Will increase to 20mg  and recheck 1 month. Call with any concerns.

## 2020-05-13 NOTE — Progress Notes (Signed)
BP 137/89   Pulse (!) 107   Temp 97.8 F (36.6 C)   Wt 185 lb (83.9 kg)   SpO2 98%   BMI 22.52 kg/m    Subjective:    Patient ID: Kevin Townsend, male    DOB: [birth year], 25 y.o.   MRN: [id number]  HPI: Kevin Townsend is a 25 y.o. male  Chief Complaint  Patient presents with  . ADD    Follow up    ADHD FOLLOW UP- doesn't feel like the dose is doing enough, it's wearing off about 11 ADHD status: uncontrolled Satisfied with current therapy: yes Medication compliance:  excellent compliance Controlled substance contract: yes Previous psychiatry evaluation: yes Previous medications: yes    Taking meds on weekends/vacations: yes Work/school performance:  good Difficulty sustaining attention/completing tasks: yes Distracted by extraneous stimuli: yes Does not listen when spoken to: no  Fidgets with hands or feet: no Unable to stay in seat: no Blurts out/interrupts others: no ADHD Medication Side Effects: no    Decreased appetite: no    Headache: no    Sleeping disturbance pattern: no    Irritability: no    Rebound effects (worse than baseline) off medication: no    Anxiousness: no    Dizziness: no    Tics: no  Relevant past medical, surgical, family and social history reviewed and updated as indicated. Interim medical history since our last visit reviewed. Allergies and medications reviewed and updated.  Review of Systems  Constitutional: Negative.   Respiratory: Negative.   Cardiovascular: Negative.   Gastrointestinal: Negative.   Musculoskeletal: Negative.   Neurological: Negative.   Psychiatric/Behavioral: Positive for decreased concentration. Negative for agitation, behavioral problems, confusion, dysphoric mood, hallucinations, self-injury, sleep disturbance and suicidal ideas. The patient is not nervous/anxious and is not hyperactive.     Per HPI unless specifically indicated above     Objective:    BP 137/89   Pulse (!) 107   Temp 97.8 F (36.6 C)    Wt 185 lb (83.9 kg)   SpO2 98%   BMI 22.52 kg/m   Wt Readings from Last 3 Encounters:  05/13/20 185 lb (83.9 kg)  04/11/20 185 lb 9.6 oz (84.2 kg)  03/05/18 156 lb 5 oz (70.9 kg)    Physical Exam Vitals and nursing note reviewed.  Constitutional:      General: He is not in acute distress.    Appearance: Normal appearance. He is not ill-appearing, toxic-appearing or diaphoretic.  HENT:     Head: Normocephalic and atraumatic.     Right Ear: External ear normal.     Left Ear: External ear normal.     Nose: Nose normal.     Mouth/Throat:     Mouth: Mucous membranes are moist.     Pharynx: Oropharynx is clear.  Eyes:     General: No scleral icterus.       Right eye: No discharge.        Left eye: No discharge.     Extraocular Movements: Extraocular movements intact.     Conjunctiva/sclera: Conjunctivae normal.     Pupils: Pupils are equal, round, and reactive to light.  Cardiovascular:     Rate and Rhythm: Normal rate and regular rhythm.     Pulses: Normal pulses.     Heart sounds: Normal heart sounds. No murmur heard. No friction rub. No gallop.   Pulmonary:     Effort: Pulmonary effort is normal. No respiratory distress.     Breath sounds: Normal  breath sounds. No stridor. No wheezing, rhonchi or rales.  Chest:     Chest wall: No tenderness.  Musculoskeletal:        General: Normal range of motion.     Cervical back: Normal range of motion and neck supple.  Skin:    General: Skin is warm and dry.     Capillary Refill: Capillary refill takes less than 2 seconds.     Coloration: Skin is not jaundiced or pale.     Findings: No bruising, erythema, lesion or rash.  Neurological:     General: No focal deficit present.     Mental Status: He is alert and oriented to person, place, and time. Mental status is at baseline.  Psychiatric:        Mood and Affect: Mood normal.        Behavior: Behavior normal.        Thought Content: Thought content normal.        Judgment:  Judgment normal.     Results for orders placed or performed in visit on 09/18/16  Rapid strep screen (not at [facility])   Specimen: Other   OTHER  Result Value Ref Range   Strep Gp A Ag, IA W/Reflex Negative Negative  Culture, Group A Strep   OTHER  Result Value Ref Range   Strep A Culture Negative       Assessment & Plan:   Problem List Items Addressed This Visit      Other   ADD (attention deficit disorder)    Meds not lasting long enough. Will increase to 20mg  and recheck 1 month. Call with any concerns.           Follow up plan: Return in about 4 weeks (around 06/10/2020).

## 2020-06-15 ENCOUNTER — Other Ambulatory Visit: Payer: Self-pay

## 2020-06-15 ENCOUNTER — Ambulatory Visit: Payer: BC Managed Care – PPO | Admitting: Family Medicine

## 2020-06-15 ENCOUNTER — Encounter: Payer: Self-pay | Admitting: Family Medicine

## 2020-06-15 DIAGNOSIS — F988 Other specified behavioral and emotional disorders with onset usually occurring in childhood and adolescence: Secondary | ICD-10-CM | POA: Diagnosis not present

## 2020-06-15 MED ORDER — AMPHETAMINE-DEXTROAMPHETAMINE 20 MG PO TABS: 20.0000 mg | ORAL_TABLET | Freq: Two times a day (BID) | ORAL | 0 refills | Status: DC

## 2020-06-15 NOTE — Assessment & Plan Note (Signed)
Under good control on current regimen. Continue current regimen. Continue to monitor. Call with any concerns. Refills given for 3 months. Follow up 3 months.    

## 2020-06-15 NOTE — Progress Notes (Signed)
Ht 6' 2.88" (1.902 m)   Wt 184 lb 6.4 oz (83.6 kg)   BMI 23.12 kg/m    Subjective:    Patient ID: Kevin Townsend, male    DOB: [birth year], 25 y.o.   MRN: [id number]  HPI: Kevin Townsend is a 25 y.o. male  Chief Complaint  Patient presents with  . ADHD   ADHD FOLLOW UP ADHD status: controlled Satisfied with current therapy: yes Medication compliance:  excellent compliance Controlled substance contract: yes Previous psychiatry evaluation: yes Previous medications: yes    Taking meds on weekends/vacations: yes Work/school performance:  excellent Difficulty sustaining attention/completing tasks: no Distracted by extraneous stimuli: no Does not listen when spoken to: no  Fidgets with hands or feet: no Unable to stay in seat: no Blurts out/interrupts others: no ADHD Medication Side Effects: no    Decreased appetite: no    Headache: no    Sleeping disturbance pattern: no    Irritability: no    Rebound effects (worse than baseline) off medication: no    Anxiousness: no    Dizziness: no    Tics: no   Relevant past medical, surgical, family and social history reviewed and updated as indicated. Interim medical history since our last visit reviewed. Allergies and medications reviewed and updated.  Review of Systems  Constitutional: Negative.   Respiratory: Negative.   Cardiovascular: Negative.   Gastrointestinal: Negative.   Musculoskeletal: Negative.   Psychiatric/Behavioral: Negative.     Per HPI unless specifically indicated above     Objective:    Ht 6' 2.88" (1.902 m)   Wt 184 lb 6.4 oz (83.6 kg)   BMI 23.12 kg/m   Wt Readings from Last 3 Encounters:  06/15/20 184 lb 6.4 oz (83.6 kg)  05/13/20 185 lb (83.9 kg)  04/11/20 185 lb 9.6 oz (84.2 kg)    Physical Exam Vitals and nursing note reviewed.  Constitutional:      General: He is not in acute distress.    Appearance: Normal appearance. He is not ill-appearing, toxic-appearing or diaphoretic.  HENT:      Head: Normocephalic and atraumatic.     Right Ear: External ear normal.     Left Ear: External ear normal.     Nose: Nose normal.     Mouth/Throat:     Mouth: Mucous membranes are moist.     Pharynx: Oropharynx is clear.  Eyes:     General: No scleral icterus.       Right eye: No discharge.        Left eye: No discharge.     Extraocular Movements: Extraocular movements intact.     Conjunctiva/sclera: Conjunctivae normal.     Pupils: Pupils are equal, round, and reactive to light.  Cardiovascular:     Rate and Rhythm: Normal rate and regular rhythm.     Pulses: Normal pulses.     Heart sounds: Normal heart sounds. No murmur heard. No friction rub. No gallop.   Pulmonary:     Effort: Pulmonary effort is normal. No respiratory distress.     Breath sounds: Normal breath sounds. No stridor. No wheezing, rhonchi or rales.  Chest:     Chest wall: No tenderness.  Musculoskeletal:        General: Normal range of motion.     Cervical back: Normal range of motion and neck supple.  Skin:    General: Skin is warm and dry.     Capillary Refill: Capillary refill takes less than 2 seconds.  Coloration: Skin is not jaundiced or pale.     Findings: No bruising, erythema, lesion or rash.  Neurological:     General: No focal deficit present.     Mental Status: He is alert and oriented to person, place, and time. Mental status is at baseline.  Psychiatric:        Mood and Affect: Mood normal.        Behavior: Behavior normal.        Thought Content: Thought content normal.        Judgment: Judgment normal.     Results for orders placed or performed in visit on 09/18/16  Rapid strep screen (not at [facility])   Specimen: Other   OTHER  Result Value Ref Range   Strep Gp A Ag, IA W/Reflex Negative Negative  Culture, Group A Strep   OTHER  Result Value Ref Range   Strep A Culture Negative       Assessment & Plan:   Problem List Items Addressed This Visit      Other   ADD (attention  deficit disorder)    Under good control on current regimen. Continue current regimen. Continue to monitor. Call with any concerns. Refills given for 3 months. Follow up 3 months.            Follow up plan: Return in about 3 months (around 09/15/2020).

## 2020-09-13 ENCOUNTER — Other Ambulatory Visit: Payer: Self-pay

## 2020-09-13 ENCOUNTER — Encounter: Payer: Self-pay | Admitting: Family Medicine

## 2020-09-13 ENCOUNTER — Ambulatory Visit: Payer: BC Managed Care – PPO | Admitting: Family Medicine

## 2020-09-13 DIAGNOSIS — F988 Other specified behavioral and emotional disorders with onset usually occurring in childhood and adolescence: Secondary | ICD-10-CM

## 2020-09-13 MED ORDER — AMPHETAMINE-DEXTROAMPHETAMINE 20 MG PO TABS: 20.0000 mg | ORAL_TABLET | Freq: Two times a day (BID) | ORAL | 0 refills | Status: DC

## 2020-09-13 MED ORDER — AMPHETAMINE-DEXTROAMPHETAMINE 20 MG PO TABS
20.0000 mg | ORAL_TABLET | Freq: Two times a day (BID) | ORAL | 0 refills | Status: DC
Start: 2020-09-13 — End: 2020-12-12

## 2020-09-13 NOTE — Assessment & Plan Note (Signed)
Under good control on current regimen. Continue current regimen. Continue to monitor. Call with any concerns. Refills given for 3 months. Follow up 3 months.    

## 2020-09-13 NOTE — Progress Notes (Signed)
BP 117/82   Pulse 69   Temp 98.2 F (36.8 C) (Oral)   Wt 179 lb 6.4 oz (81.4 kg)   SpO2 98%   BMI 22.49 kg/m    Subjective:    Patient ID: Kevin Townsend, male    DOB: 06-13-1995, 25 y.o.   MRN: [id number]  HPI: Kevin Townsend is a 25 y.o. male  Chief Complaint  Patient presents with   ADHD    3 month f/up    ADHD FOLLOW UP ADHD status: controlled Satisfied with current therapy: yes Medication compliance:  excellent compliance Controlled substance contract: yes Previous psychiatry evaluation: yes Previous medications: yes    Taking meds on weekends/vacations: yes Work/school performance:  excellent Difficulty sustaining attention/completing tasks: no Distracted by extraneous stimuli: no Does not listen when spoken to: no  Fidgets with hands or feet: no Unable to stay in seat: no Blurts out/interrupts others: no ADHD Medication Side Effects: no    Decreased appetite: no    Headache: no    Sleeping disturbance pattern: no    Irritability: no    Rebound effects (worse than baseline) off medication: no    Anxiousness: no    Dizziness: no    Tics: no  Relevant past medical, surgical, family and social history reviewed and updated as indicated. Interim medical history since our last visit reviewed. Allergies and medications reviewed and updated.  Review of Systems  Constitutional: Negative.   Respiratory: Negative.    Cardiovascular: Negative.   Gastrointestinal: Negative.   Musculoskeletal: Negative.   Neurological: Negative.   Psychiatric/Behavioral: Negative.     Per HPI unless specifically indicated above     Objective:    BP 117/82   Pulse 69   Temp 98.2 F (36.8 C) (Oral)   Wt 179 lb 6.4 oz (81.4 kg)   SpO2 98%   BMI 22.49 kg/m   Wt Readings from Last 3 Encounters:  09/13/20 179 lb 6.4 oz (81.4 kg)  06/15/20 184 lb 6.4 oz (83.6 kg)  05/13/20 185 lb (83.9 kg)    Physical Exam Vitals and nursing note reviewed.  Constitutional:      General:  He is not in acute distress.    Appearance: Normal appearance. He is not ill-appearing, toxic-appearing or diaphoretic.  HENT:     Head: Normocephalic and atraumatic.     Right Ear: External ear normal.     Left Ear: External ear normal.     Nose: Nose normal.     Mouth/Throat:     Mouth: Mucous membranes are moist.     Pharynx: Oropharynx is clear.  Eyes:     General: No scleral icterus.       Right eye: No discharge.        Left eye: No discharge.     Extraocular Movements: Extraocular movements intact.     Conjunctiva/sclera: Conjunctivae normal.     Pupils: Pupils are equal, round, and reactive to light.  Cardiovascular:     Rate and Rhythm: Normal rate and regular rhythm.     Pulses: Normal pulses.     Heart sounds: Normal heart sounds. No murmur heard.   No friction rub. No gallop.  Pulmonary:     Effort: Pulmonary effort is normal. No respiratory distress.     Breath sounds: Normal breath sounds. No stridor. No wheezing, rhonchi or rales.  Chest:     Chest wall: No tenderness.  Musculoskeletal:        General: Normal range of motion.  Cervical back: Normal range of motion and neck supple.  Skin:    General: Skin is warm and dry.     Capillary Refill: Capillary refill takes less than 2 seconds.     Coloration: Skin is not jaundiced or pale.     Findings: No bruising, erythema, lesion or rash.  Neurological:     General: No focal deficit present.     Mental Status: He is alert and oriented to person, place, and time. Mental status is at baseline.  Psychiatric:        Mood and Affect: Mood normal.        Behavior: Behavior normal.        Thought Content: Thought content normal.        Judgment: Judgment normal.    Results for orders placed or performed in visit on 09/18/16  Rapid strep screen (not at [facility])   Specimen: Other   OTHER  Result Value Ref Range   Strep Gp A Ag, IA W/Reflex Negative Negative  Culture, Group A Strep   OTHER  Result Value Ref Range    Strep A Culture Negative       Assessment & Plan:   Problem List Items Addressed This Visit       Other   ADD (attention deficit disorder)    Under good control on current regimen. Continue current regimen. Continue to monitor. Call with any concerns. Refills given for 3 months. Follow up 3 months.           Follow up plan: Return in about 3 months (around 12/14/2020) for physical.

## 2020-12-12 ENCOUNTER — Encounter: Payer: Self-pay | Admitting: Family Medicine

## 2020-12-12 ENCOUNTER — Other Ambulatory Visit: Payer: Self-pay

## 2020-12-12 ENCOUNTER — Ambulatory Visit: Payer: BC Managed Care – PPO | Admitting: Family Medicine

## 2020-12-12 VITALS — BP 124/80 | HR 97 | Temp 98.0°F | Wt 182.0 lb

## 2020-12-12 DIAGNOSIS — F988 Other specified behavioral and emotional disorders with onset usually occurring in childhood and adolescence: Secondary | ICD-10-CM | POA: Diagnosis not present

## 2020-12-12 DIAGNOSIS — Z23 Encounter for immunization: Secondary | ICD-10-CM | POA: Diagnosis not present

## 2020-12-12 DIAGNOSIS — Z0000 Encounter for general adult medical examination without abnormal findings: Secondary | ICD-10-CM

## 2020-12-12 LAB — URINALYSIS, ROUTINE W REFLEX MICROSCOPIC
Bilirubin, UA: NEGATIVE
Glucose, UA: NEGATIVE
Ketones, UA: NEGATIVE
Leukocytes,UA: NEGATIVE
Nitrite, UA: NEGATIVE
RBC, UA: NEGATIVE
Specific Gravity, UA: 1.015 (ref 1.005–1.030)
Urobilinogen, Ur: 1 mg/dL (ref 0.2–1.0)
pH, UA: 8.5 — ABNORMAL HIGH (ref 5.0–7.5)

## 2020-12-12 LAB — MICROSCOPIC EXAMINATION
Bacteria, UA: NONE SEEN
Epithelial Cells (non renal): NONE SEEN /hpf (ref 0–10)
RBC, Urine: NONE SEEN /hpf (ref 0–2)
WBC, UA: NONE SEEN /hpf (ref 0–5)

## 2020-12-12 MED ORDER — AMPHETAMINE-DEXTROAMPHETAMINE 20 MG PO TABS: 20.0000 mg | ORAL_TABLET | Freq: Two times a day (BID) | ORAL | 0 refills | Status: DC

## 2020-12-12 NOTE — Progress Notes (Signed)
BP 124/80   Pulse 97   Temp 98 F (36.7 C)   Wt 182 lb (82.6 kg)   SpO2 98%   BMI 22.82 kg/m    Subjective:    Patient ID: Kevin Townsend, male    DOB: 07/17/1995, 25 y.o.   MRN: [id number]  HPI: Kevin Townsend is a 25 y.o. male presenting on 12/12/2020 for comprehensive medical examination. Current medical complaints include:  ADHD FOLLOW UP ADHD status: controlled Satisfied with current therapy: yes Medication compliance:  excellent compliance Controlled substance contract: yes Previous psychiatry evaluation: yes Previous medications: yes    Taking meds on weekends/vacations: yes Work/school performance:  excellent Difficulty sustaining attention/completing tasks: no Distracted by extraneous stimuli: no Does not listen when spoken to: no  Fidgets with hands or feet: no Unable to stay in seat: no Blurts out/interrupts others: no ADHD Medication Side Effects: no    Decreased appetite: no    Headache: no    Sleeping disturbance pattern: no    Irritability: no    Rebound effects (worse than baseline) off medication: no    Anxiousness: no    Dizziness: no    Tics: no  Interim Problems from his last visit: no  Depression Screen done today and results listed below:  Depression screen [facility] 2/9 06/15/2020 04/11/2020 06/08/2016 08/19/2015  Decreased Interest 0 0 0 0  Down, Depressed, Hopeless 0 0 0 0  PHQ - 2 Score 0 0 0 0     Past Medical History:  Past Medical History:  Diagnosis Date   ADHD (attention deficit hyperactivity disorder)    Foreign body of stomach, initial encounter    Ingestion of foreign body - intentional wrench ingestion 05/02/2017   Underweight     Surgical History:  Past Surgical History:  Procedure Laterality Date   ESOPHAGOGASTRODUODENOSCOPY N/A 05/02/2017   Procedure: ESOPHAGOGASTRODUODENOSCOPY (EGD);  Surgeon: Iva Boop, MD;  Location: [facility];  Service: Endoscopy;  Laterality: N/A;   WISDOM TOOTH EXTRACTION      Medications:  No  current outpatient medications on file prior to visit.   No current facility-administered medications on file prior to visit.    Allergies:  No Known Allergies  Social History:  Social History   Socioeconomic History   Marital status: Single    Spouse name: Not on file   Number of children: 0   Years of education: Not on file   Highest education level: Not on file  Occupational History   Occupation: Student  Tobacco Use   Smoking status: Former   Smokeless tobacco: Never  Building services engineer Use: Never used  Substance and Sexual Activity   Alcohol use: Yes    Alcohol/week: 0.0 standard drinks    Comment: occasionlly    Drug use: No   Sexual activity: Not Currently  Other Topics Concern   Not on file  Social History Narrative   Not on file   Social Determinants of Health   Financial Resource Strain: Not on file  Food Insecurity: Not on file  Transportation Needs: Not on file  Physical Activity: Not on file  Stress: Not on file  Social Connections: Not on file  Intimate Partner Violence: Not on file   Social History   Tobacco Use  Smoking Status Former  Smokeless Tobacco Never   Social History   Substance and Sexual Activity  Alcohol Use Yes   Alcohol/week: 0.0 standard drinks   Comment: occasionlly     Family History:  History reviewed. No pertinent family history.  Past medical history, surgical history, medications, allergies, family history and social history reviewed with patient today and changes made to appropriate areas of the chart.   Review of Systems  Constitutional: Negative.   HENT: Negative.    Eyes: Negative.   Respiratory: Negative.    Cardiovascular: Negative.   Gastrointestinal: Negative.   Genitourinary: Negative.   Musculoskeletal: Negative.   Skin: Negative.   Neurological: Negative.   Endo/Heme/Allergies: Negative.   Psychiatric/Behavioral: Negative.    All other ROS negative except what is listed above and in the HPI.       Objective:    BP 124/80   Pulse 97   Temp 98 F (36.7 C)   Wt 182 lb (82.6 kg)   SpO2 98%   BMI 22.82 kg/m   Wt Readings from Last 3 Encounters:  12/12/20 182 lb (82.6 kg)  09/13/20 179 lb 6.4 oz (81.4 kg)  06/15/20 184 lb 6.4 oz (83.6 kg)    Physical Exam Vitals and nursing note reviewed.  Constitutional:      General: He is not in acute distress.    Appearance: Normal appearance. He is not ill-appearing, toxic-appearing or diaphoretic.  HENT:     Head: Normocephalic and atraumatic.     Right Ear: Tympanic membrane, ear canal and external ear normal. There is no impacted cerumen.     Left Ear: Tympanic membrane, ear canal and external ear normal. There is no impacted cerumen.     Nose: Nose normal. No congestion or rhinorrhea.     Mouth/Throat:     Mouth: Mucous membranes are moist.     Pharynx: Oropharynx is clear. No oropharyngeal exudate or posterior oropharyngeal erythema.  Eyes:     General: No scleral icterus.       Right eye: No discharge.        Left eye: No discharge.     Extraocular Movements: Extraocular movements intact.     Conjunctiva/sclera: Conjunctivae normal.     Pupils: Pupils are equal, round, and reactive to light.  Neck:     Vascular: No carotid bruit.  Cardiovascular:     Rate and Rhythm: Normal rate and regular rhythm.     Pulses: Normal pulses.     Heart sounds: No murmur heard.   No friction rub. No gallop.  Pulmonary:     Effort: Pulmonary effort is normal. No respiratory distress.     Breath sounds: Normal breath sounds. No stridor. No wheezing, rhonchi or rales.  Chest:     Chest wall: No tenderness.  Abdominal:     General: Abdomen is flat. Bowel sounds are normal. There is no distension.     Palpations: Abdomen is soft. There is no mass.     Tenderness: There is no abdominal tenderness. There is no right CVA tenderness, left CVA tenderness, guarding or rebound.     Hernia: No hernia is present.  Genitourinary:    Comments:  Genital exam deferred with shared decision making Musculoskeletal:        General: No swelling, tenderness, deformity or signs of injury.     Cervical back: Normal range of motion and neck supple. No rigidity. No muscular tenderness.     Right lower leg: No edema.     Left lower leg: No edema.  Lymphadenopathy:     Cervical: No cervical adenopathy.  Skin:    General: Skin is warm and dry.     Capillary Refill: Capillary refill  takes less than 2 seconds.     Coloration: Skin is not jaundiced or pale.     Findings: No bruising, erythema, lesion or rash.  Neurological:     General: No focal deficit present.     Mental Status: He is alert and oriented to person, place, and time.     Cranial Nerves: No cranial nerve deficit.     Sensory: No sensory deficit.     Motor: No weakness.     Coordination: Coordination normal.     Gait: Gait normal.     Deep Tendon Reflexes: Reflexes normal.  Psychiatric:        Mood and Affect: Mood normal.        Behavior: Behavior normal.        Thought Content: Thought content normal.        Judgment: Judgment normal.    Results for orders placed or performed in visit on 09/18/16  Rapid strep screen (not at [facility])   Specimen: Other   OTHER  Result Value Ref Range   Strep Gp A Ag, IA W/Reflex Negative Negative  Culture, Group A Strep   OTHER  Result Value Ref Range   Strep A Culture Negative       Assessment & Plan:   Problem List Items Addressed This Visit       Other   ADD (attention deficit disorder)    Under good control on current regimen. Continue current regimen. Continue to monitor. Call with any concerns. Refills given for 3 months. Follow up 3 months.        Other Visit Diagnoses     Routine general medical examination at a health care facility    -  Primary   Vaccines up to date. Screening labs checked today. Continue diet and exercise. Call with any concerns. Continue to monitor.    Relevant Orders   Comprehensive metabolic  panel   CBC with Differential/Platelet   Lipid Panel w/o Chol/HDL Ratio   TSH   Urinalysis, Routine w reflex microscopic   Hepatitis C Antibody   HIV Antibody (routine testing w rflx)   Need for tetanus booster           LABORATORY TESTING:  Health maintenance labs ordered today as discussed above.   IMMUNIZATIONS:   - Tdap: Tetanus vaccination status reviewed: Tdap vaccination indicated and given today. - Influenza: Given elsewhere - Pneumovax: Not applicable - Prevnar: Not applicable - HPV: Up to date   PATIENT COUNSELING:    Sexuality: Discussed sexually transmitted diseases, partner selection, use of condoms, avoidance of unintended pregnancy  and contraceptive alternatives.   Advised to avoid cigarette smoking.  I discussed with the patient that most people either abstain from alcohol or drink within safe limits (<=14/week and <=4 drinks/occasion for males, <=7/weeks and <= 3 drinks/occasion for females) and that the risk for alcohol disorders and other health effects rises proportionally with the number of drinks per week and how often a drinker exceeds daily limits.  Discussed cessation/primary prevention of drug use and availability of treatment for abuse.   Diet: Encouraged to adjust caloric intake to maintain  or achieve ideal body weight, to reduce intake of dietary saturated fat and total fat, to limit sodium intake by avoiding high sodium foods and not adding table salt, and to maintain adequate dietary potassium and calcium preferably from fresh fruits, vegetables, and low-fat dairy products.    stressed the importance of regular exercise  Injury prevention: Discussed safety belts,  safety helmets, smoke detector, smoking near bedding or upholstery.   Dental health: Discussed importance of regular tooth brushing, flossing, and dental visits.   Follow up plan: NEXT PREVENTATIVE PHYSICAL DUE IN 1 YEAR. Return in about 3 months (around 03/14/2021).

## 2020-12-12 NOTE — Assessment & Plan Note (Signed)
Under good control on current regimen. Continue current regimen. Continue to monitor. Call with any concerns. Refills given for 3 months. Follow up 3 months.    

## 2020-12-13 LAB — LIPID PANEL W/O CHOL/HDL RATIO
Cholesterol, Total: 248 mg/dL — ABNORMAL HIGH (ref 100–199)
HDL: 74 mg/dL (ref >39)
LDL Chol Calc (NIH): 155 mg/dL — ABNORMAL HIGH (ref 0–99)
Triglycerides: 108 mg/dL (ref 0–149)
VLDL Cholesterol Cal: 19 mg/dL (ref 5–40)

## 2020-12-13 LAB — COMPREHENSIVE METABOLIC PANEL
ALT: 136 IU/L — ABNORMAL HIGH (ref 0–44)
AST: 102 IU/L — ABNORMAL HIGH (ref 0–40)
Albumin/Globulin Ratio: 1.7 (ref 1.2–2.2)
Albumin: 5.2 g/dL (ref 4.1–5.2)
Alkaline Phosphatase: 85 IU/L (ref 44–121)
BUN/Creatinine Ratio: 14 (ref 9–20)
BUN: 11 mg/dL (ref 6–20)
Bilirubin Total: 0.3 mg/dL (ref 0.0–1.2)
CO2: 25 mmol/L (ref 20–29)
Calcium: 9.8 mg/dL (ref 8.7–10.2)
Chloride: 97 mmol/L (ref 96–106)
Creatinine, Ser: 0.77 mg/dL (ref 0.76–1.27)
Globulin, Total: 3 g/dL (ref 1.5–4.5)
Glucose: 108 mg/dL — ABNORMAL HIGH (ref 70–99)
Potassium: 4.2 mmol/L (ref 3.5–5.2)
Sodium: 140 mmol/L (ref 134–144)
Total Protein: 8.2 g/dL (ref 6.0–8.5)
eGFR: 127 mL/min/{1.73_m2} (ref >59)

## 2020-12-13 LAB — CBC WITH DIFFERENTIAL/PLATELET
Basophils Absolute: 0 10*3/uL (ref 0.0–0.2)
Basos: 1 %
EOS (ABSOLUTE): 0 10*3/uL (ref 0.0–0.4)
Eos: 1 %
Hematocrit: 47.8 % (ref 37.5–51.0)
Hemoglobin: 16.2 g/dL (ref 13.0–17.7)
Immature Grans (Abs): 0 10*3/uL (ref 0.0–0.1)
Immature Granulocytes: 0 %
Lymphocytes Absolute: 1.5 10*3/uL (ref 0.7–3.1)
Lymphs: 26 %
MCH: 31.7 pg (ref 26.6–33.0)
MCHC: 33.9 g/dL (ref 31.5–35.7)
MCV: 94 fL (ref 79–97)
Monocytes Absolute: 0.5 10*3/uL (ref 0.1–0.9)
Monocytes: 8 %
Neutrophils Absolute: 3.7 10*3/uL (ref 1.4–7.0)
Neutrophils: 64 %
Platelets: 223 10*3/uL (ref 150–450)
RBC: 5.11 x10E6/uL (ref 4.14–5.80)
RDW: 12.4 % (ref 11.6–15.4)
WBC: 5.7 10*3/uL (ref 3.4–10.8)

## 2020-12-13 LAB — HEPATITIS C ANTIBODY: Hep C Virus Ab: <0.1 s/co ratio (ref 0.0–0.9)

## 2020-12-13 LAB — HIV ANTIBODY (ROUTINE TESTING W REFLEX): HIV Screen 4th Generation wRfx: NONREACTIVE

## 2020-12-13 LAB — TSH: TSH: 1.42 u[IU]/mL (ref 0.450–4.500)

## 2021-01-02 DIAGNOSIS — L538 Other specified erythematous conditions: Secondary | ICD-10-CM | POA: Diagnosis not present

## 2021-01-02 DIAGNOSIS — D485 Neoplasm of uncertain behavior of skin: Secondary | ICD-10-CM | POA: Diagnosis not present

## 2021-01-02 DIAGNOSIS — D225 Melanocytic nevi of trunk: Secondary | ICD-10-CM | POA: Diagnosis not present

## 2021-01-18 ENCOUNTER — Ambulatory Visit: Payer: BC Managed Care – PPO | Admitting: Family Medicine

## 2021-02-24 ENCOUNTER — Telehealth: Payer: Self-pay | Admitting: Family Medicine

## 2021-02-24 NOTE — Telephone Encounter (Signed)
Medication Refill - Medication: Naltrexone 50 MG daily   Has the patient contacted their pharmacy? Yes.   (Agent: If no, request that the patient contact the pharmacy for the refill. If patient does not wish to contact the pharmacy document the reason why and proceed with request.) (Agent: If yes, when and what did the pharmacy advise?)  Preferred Pharmacy (with phone number or street name):  [facility] [id number] [address]  [address] [place] [address]  Phone: [phone] Fax: [phone]   Has the patient been seen for an appointment in the last year OR does the patient have an upcoming appointment? Yes.    Agent: Please be advised that RX refills may take up to 3 business days. We ask that you follow-up with your pharmacy.

## 2021-02-28 ENCOUNTER — Encounter: Payer: Self-pay | Admitting: Family Medicine

## 2021-02-28 NOTE — Telephone Encounter (Signed)
Pt states he will send a MyChart message to Dr. Laural Benes directly

## 2021-02-28 NOTE — Telephone Encounter (Signed)
Patient needs to contact me himself if he would like this medicine. His mother has contacted me. Patient has not.

## 2021-02-28 NOTE — Telephone Encounter (Signed)
Pts mother called back and is still requesting this Rx be sent in, caller stated the pt can also be contacted directly as well.

## 2021-03-06 ENCOUNTER — Ambulatory Visit: Payer: BC Managed Care – PPO | Admitting: Family Medicine

## 2021-03-06 ENCOUNTER — Encounter: Payer: Self-pay | Admitting: Family Medicine

## 2021-03-06 ENCOUNTER — Other Ambulatory Visit: Payer: Self-pay

## 2021-03-06 VITALS — BP 113/80 | HR 105 | Temp 98.0°F | Wt 186.6 lb

## 2021-03-06 DIAGNOSIS — Z23 Encounter for immunization: Secondary | ICD-10-CM | POA: Diagnosis not present

## 2021-03-06 DIAGNOSIS — F1010 Alcohol abuse, uncomplicated: Secondary | ICD-10-CM

## 2021-03-06 DIAGNOSIS — R748 Abnormal levels of other serum enzymes: Secondary | ICD-10-CM

## 2021-03-06 DIAGNOSIS — F988 Other specified behavioral and emotional disorders with onset usually occurring in childhood and adolescence: Secondary | ICD-10-CM

## 2021-03-06 MED ORDER — NALTREXONE HCL 50 MG PO TABS: 50.0000 mg | ORAL_TABLET | Freq: Every day | ORAL | 1 refills | Status: DC

## 2021-03-06 NOTE — Assessment & Plan Note (Signed)
Had been on naltrexone previously. Does well with it. Will restart. Call with any concerns.

## 2021-03-06 NOTE — Assessment & Plan Note (Signed)
Under good control on current regimen. Continue current regimen. Continue to monitor. Call with any concerns. Refills given for 3 months. Follow up 3 months.    

## 2021-03-06 NOTE — Progress Notes (Signed)
BP 113/80    Pulse (!) 105    Temp 98 F (36.7 C)    Wt 186 lb 9.6 oz (84.6 kg)    SpO2 98%    BMI 23.40 kg/m    Subjective:    Patient ID: Kevin Townsend, male    DOB: 10/16/1995, 26 y.o.   MRN: [id number]  HPI: Kevin Townsend is a 26 y.o. male  Chief Complaint  Patient presents with   ADD   Has been drinking more previously. He had been on naltrexone previously- restarted old Rx and tolerating it well. It's helping to cut down on his drinking.   ADHD FOLLOW UP ADHD status: controlled Satisfied with current therapy: yes Medication compliance:  excellent compliance Controlled substance contract: no Previous psychiatry evaluation: yes Previous medications: yes    Taking meds on weekends/vacations: yes Work/school performance:  good Difficulty sustaining attention/completing tasks: no Distracted by extraneous stimuli: no Does not listen when spoken to: no  Fidgets with hands or feet: no Unable to stay in seat: no Blurts out/interrupts others: no ADHD Medication Side Effects: no    Decreased appetite: no    Headache: no    Sleeping disturbance pattern: no    Irritability: no    Rebound effects (worse than baseline) off medication: no    Anxiousness: no    Dizziness: no    Tics: no  Relevant past medical, surgical, family and social history reviewed and updated as indicated. Interim medical history since our last visit reviewed. Allergies and medications reviewed and updated.  Review of Systems  Constitutional: Negative.   Respiratory: Negative.    Cardiovascular: Negative.   Gastrointestinal: Negative.   Musculoskeletal: Negative.   Neurological: Negative.   Psychiatric/Behavioral: Negative.     Per HPI unless specifically indicated above     Objective:    BP 113/80    Pulse (!) 105    Temp 98 F (36.7 C)    Wt 186 lb 9.6 oz (84.6 kg)    SpO2 98%    BMI 23.40 kg/m   Wt Readings from Last 3 Encounters:  03/06/21 186 lb 9.6 oz (84.6 kg)  12/12/20 182 lb (82.6  kg)  09/13/20 179 lb 6.4 oz (81.4 kg)    Physical Exam Vitals and nursing note reviewed.  Constitutional:      General: He is not in acute distress.    Appearance: Normal appearance. He is not ill-appearing, toxic-appearing or diaphoretic.  HENT:     Head: Normocephalic and atraumatic.     Right Ear: External ear normal.     Left Ear: External ear normal.     Nose: Nose normal.     Mouth/Throat:     Mouth: Mucous membranes are moist.     Pharynx: Oropharynx is clear.  Eyes:     General: No scleral icterus.       Right eye: No discharge.        Left eye: No discharge.     Extraocular Movements: Extraocular movements intact.     Conjunctiva/sclera: Conjunctivae normal.     Pupils: Pupils are equal, round, and reactive to light.  Cardiovascular:     Rate and Rhythm: Normal rate and regular rhythm.     Pulses: Normal pulses.     Heart sounds: Normal heart sounds. No murmur heard.   No friction rub. No gallop.  Pulmonary:     Effort: Pulmonary effort is normal. No respiratory distress.     Breath sounds: Normal breath sounds.  No stridor. No wheezing, rhonchi or rales.  Chest:     Chest wall: No tenderness.  Musculoskeletal:        General: Normal range of motion.     Cervical back: Normal range of motion and neck supple.  Skin:    General: Skin is warm and dry.     Capillary Refill: Capillary refill takes less than 2 seconds.     Coloration: Skin is not jaundiced or pale.     Findings: No bruising, erythema, lesion or rash.  Neurological:     General: No focal deficit present.     Mental Status: He is alert and oriented to person, place, and time. Mental status is at baseline.  Psychiatric:        Mood and Affect: Mood normal.        Behavior: Behavior normal.        Thought Content: Thought content normal.        Judgment: Judgment normal.    Results for orders placed or performed in visit on 12/12/20  Microscopic Examination   Urine  Result Value Ref Range   WBC,  UA None seen 0 - 5 /hpf   RBC None seen 0 - 2 /hpf   Epithelial Cells (non renal) None seen 0 - 10 /hpf   Mucus, UA Present (A) Not Estab.   Bacteria, UA None seen None seen/Few  Comprehensive metabolic panel  Result Value Ref Range   Glucose 108 (H) 70 - 99 mg/dL   BUN 11 6 - 20 mg/dL   Creatinine, Ser 0.77 0.76 - 1.27 mg/dL   eGFR 127 >59 mL/min/1.73   BUN/Creatinine Ratio 14 9 - 20   Sodium 140 134 - 144 mmol/L   Potassium 4.2 3.5 - 5.2 mmol/L   Chloride 97 96 - 106 mmol/L   CO2 25 20 - 29 mmol/L   Calcium 9.8 8.7 - 10.2 mg/dL   Total Protein 8.2 6.0 - 8.5 g/dL   Albumin 5.2 4.1 - 5.2 g/dL   Globulin, Total 3.0 1.5 - 4.5 g/dL   Albumin/Globulin Ratio 1.7 1.2 - 2.2   Bilirubin Total 0.3 0.0 - 1.2 mg/dL   Alkaline Phosphatase 85 44 - 121 IU/L   AST 102 (H) 0 - 40 IU/L   ALT 136 (H) 0 - 44 IU/L  CBC with Differential/Platelet  Result Value Ref Range   WBC 5.7 3.4 - 10.8 x10E3/uL   RBC 5.11 4.14 - 5.80 x10E6/uL   Hemoglobin 16.2 13.0 - 17.7 g/dL   Hematocrit 47.8 37.5 - 51.0 %   MCV 94 79 - 97 fL   MCH 31.7 26.6 - 33.0 pg   MCHC 33.9 31.5 - 35.7 g/dL   RDW 12.4 11.6 - 15.4 %   Platelets 223 150 - 450 x10E3/uL   Neutrophils 64 Not Estab. %   Lymphs 26 Not Estab. %   Monocytes 8 Not Estab. %   Eos 1 Not Estab. %   Basos 1 Not Estab. %   Neutrophils Absolute 3.7 1.4 - 7.0 x10E3/uL   Lymphocytes Absolute 1.5 0.7 - 3.1 x10E3/uL   Monocytes Absolute 0.5 0.1 - 0.9 x10E3/uL   EOS (ABSOLUTE) 0.0 0.0 - 0.4 x10E3/uL   Basophils Absolute 0.0 0.0 - 0.2 x10E3/uL   Immature Granulocytes 0 Not Estab. %   Immature Grans (Abs) 0.0 0.0 - 0.1 x10E3/uL  Lipid Panel w/o Chol/HDL Ratio  Result Value Ref Range   Cholesterol, Total 248 (H) 100 - 199 mg/dL  Triglycerides 108 0 - 149 mg/dL   HDL 74 >39 mg/dL   VLDL Cholesterol Cal 19 5 - 40 mg/dL   LDL Chol Calc (NIH) 155 (H) 0 - 99 mg/dL  TSH  Result Value Ref Range   TSH 1.420 0.450 - 4.500 uIU/mL  Urinalysis, Routine w reflex  microscopic  Result Value Ref Range   Specific Gravity, UA 1.015 1.005 - 1.030   pH, UA 8.5 (H) 5.0 - 7.5   Color, UA Yellow Yellow   Appearance Ur Clear Clear   Leukocytes,UA Negative Negative   Protein,UA 1+ (A) Negative/Trace   Glucose, UA Negative Negative   Ketones, UA Negative Negative   RBC, UA Negative Negative   Bilirubin, UA Negative Negative   Urobilinogen, Ur 1.0 0.2 - 1.0 mg/dL   Nitrite, UA Negative Negative   Microscopic Examination See below:   Hepatitis C Antibody  Result Value Ref Range   Hep C Virus Ab <0.1 0.0 - 0.9 s/co ratio  HIV Antibody (routine testing w rflx)  Result Value Ref Range   HIV Screen 4th Generation wRfx Non Reactive Non Reactive      Assessment & Plan:   Problem List Items Addressed This Visit       Other   ADD (attention deficit disorder) - Primary    Under good control on current regimen. Continue current regimen. Continue to monitor. Call with any concerns. Refills given for 3 months. Follow up 3 months.        Alcohol abuse    Had been on naltrexone previously. Does well with it. Will restart. Call with any concerns.       Other Visit Diagnoses     Elevated liver enzymes       Rechecking labs today. Await results.    Relevant Orders   Comprehensive metabolic panel        Follow up plan: Return in about 3 months (around 06/04/2021).

## 2021-03-07 LAB — COMPREHENSIVE METABOLIC PANEL
ALT: 74 IU/L — ABNORMAL HIGH (ref 0–44)
AST: 51 IU/L — ABNORMAL HIGH (ref 0–40)
Albumin/Globulin Ratio: 2 (ref 1.2–2.2)
Albumin: 5.1 g/dL (ref 4.1–5.2)
Alkaline Phosphatase: 79 IU/L (ref 44–121)
BUN/Creatinine Ratio: 13 (ref 9–20)
BUN: 9 mg/dL (ref 6–20)
Bilirubin Total: 0.3 mg/dL (ref 0.0–1.2)
CO2: 28 mmol/L (ref 20–29)
Calcium: 9.7 mg/dL (ref 8.7–10.2)
Chloride: 100 mmol/L (ref 96–106)
Creatinine, Ser: 0.69 mg/dL — ABNORMAL LOW (ref 0.76–1.27)
Globulin, Total: 2.6 g/dL (ref 1.5–4.5)
Glucose: 87 mg/dL (ref 70–99)
Potassium: 4.2 mmol/L (ref 3.5–5.2)
Sodium: 143 mmol/L (ref 134–144)
Total Protein: 7.7 g/dL (ref 6.0–8.5)
eGFR: 132 mL/min/{1.73_m2} (ref >59)

## 2021-04-03 ENCOUNTER — Other Ambulatory Visit: Payer: Self-pay | Admitting: Family Medicine

## 2021-04-03 ENCOUNTER — Ambulatory Visit: Payer: BC Managed Care – PPO | Admitting: Family Medicine

## 2021-04-03 ENCOUNTER — Other Ambulatory Visit: Payer: Self-pay

## 2021-04-03 MED ORDER — AMPHETAMINE-DEXTROAMPHETAMINE 20 MG PO TABS: 20.0000 mg | ORAL_TABLET | Freq: Two times a day (BID) | ORAL | 0 refills | Status: DC

## 2021-07-18 ENCOUNTER — Ambulatory Visit: Payer: BC Managed Care – PPO | Admitting: Family Medicine

## 2021-08-03 ENCOUNTER — Encounter: Payer: Self-pay | Admitting: Family Medicine

## 2021-08-03 ENCOUNTER — Ambulatory Visit: Payer: BC Managed Care – PPO | Admitting: Family Medicine

## 2021-08-03 VITALS — BP 116/78 | HR 73 | Temp 98.3°F | Wt 198.4 lb

## 2021-08-03 DIAGNOSIS — F988 Other specified behavioral and emotional disorders with onset usually occurring in childhood and adolescence: Secondary | ICD-10-CM

## 2021-08-03 MED ORDER — AMPHETAMINE-DEXTROAMPHETAMINE 20 MG PO TABS: 20.0000 mg | ORAL_TABLET | Freq: Two times a day (BID) | ORAL | 0 refills | Status: DC

## 2021-08-03 NOTE — Assessment & Plan Note (Addendum)
Has been off his medicine for a couple of weeks, but notes that he really needs it. Refills given. Utox done. Recheck 3 months.

## 2021-08-03 NOTE — Progress Notes (Signed)
BP 116/78   Pulse 73   Temp 98.3 F (36.8 C) (Oral)   Wt 198 lb 6.4 oz (90 kg)   SpO2 97%   BMI 24.88 kg/m    Subjective:    Patient ID: Kevin Townsend, male    DOB: 10/04/1995, 26 y.o.   MRN: [id number]  HPI: Kevin Townsend is a 26 y.o. male  Chief Complaint  Patient presents with   ADHD   ADHD FOLLOW UP- has been out of his medicine for a few weeks, wasn't able to get an appointment that worked with his schedule ADHD status: stable Satisfied with current therapy: yes Medication compliance:  fair compliance Controlled substance contract: yes Previous psychiatry evaluation: yes Previous medications: yes    Taking meds on weekends/vacations: occasionally Work/school performance:  good Difficulty sustaining attention/completing tasks: no Distracted by extraneous stimuli: no Does not listen when spoken to: no  Fidgets with hands or feet: no Unable to stay in seat: no Blurts out/interrupts others: no ADHD Medication Side Effects: no    Decreased appetite: no    Headache: no    Sleeping disturbance pattern: no    Irritability: no    Rebound effects (worse than baseline) off medication: no    Anxiousness: no    Dizziness: no    Tics: no  Relevant past medical, surgical, family and social history reviewed and updated as indicated. Interim medical history since our last visit reviewed. Allergies and medications reviewed and updated.  Review of Systems  Constitutional: Negative.   Respiratory: Negative.    Cardiovascular: Negative.   Gastrointestinal: Negative.   Musculoskeletal: Negative.   Psychiatric/Behavioral: Negative.      Per HPI unless specifically indicated above     Objective:    BP 116/78   Pulse 73   Temp 98.3 F (36.8 C) (Oral)   Wt 198 lb 6.4 oz (90 kg)   SpO2 97%   BMI 24.88 kg/m   Wt Readings from Last 3 Encounters:  08/03/21 198 lb 6.4 oz (90 kg)  03/06/21 186 lb 9.6 oz (84.6 kg)  12/12/20 182 lb (82.6 kg)    Physical Exam Vitals and  nursing note reviewed.  Constitutional:      General: He is not in acute distress.    Appearance: Normal appearance. He is normal weight. He is not ill-appearing, toxic-appearing or diaphoretic.  HENT:     Head: Normocephalic and atraumatic.     Right Ear: External ear normal.     Left Ear: External ear normal.     Nose: Nose normal.     Mouth/Throat:     Mouth: Mucous membranes are moist.     Pharynx: Oropharynx is clear.  Eyes:     General: No scleral icterus.       Right eye: No discharge.        Left eye: No discharge.     Extraocular Movements: Extraocular movements intact.     Conjunctiva/sclera: Conjunctivae normal.     Pupils: Pupils are equal, round, and reactive to light.  Cardiovascular:     Rate and Rhythm: Normal rate and regular rhythm.     Pulses: Normal pulses.     Heart sounds: Normal heart sounds. No murmur heard.    No friction rub. No gallop.  Pulmonary:     Effort: Pulmonary effort is normal. No respiratory distress.     Breath sounds: Normal breath sounds. No stridor. No wheezing, rhonchi or rales.  Chest:     Chest wall:  No tenderness.  Musculoskeletal:        General: Normal range of motion.     Cervical back: Normal range of motion and neck supple.  Skin:    General: Skin is warm and dry.     Capillary Refill: Capillary refill takes less than 2 seconds.     Coloration: Skin is not jaundiced or pale.     Findings: No bruising, erythema, lesion or rash.  Neurological:     General: No focal deficit present.     Mental Status: He is alert and oriented to person, place, and time. Mental status is at baseline.  Psychiatric:        Mood and Affect: Mood normal.        Behavior: Behavior normal.        Thought Content: Thought content normal.        Judgment: Judgment normal.     Results for orders placed or performed in visit on 03/06/21  Comprehensive metabolic panel  Result Value Ref Range   Glucose 87 70 - 99 mg/dL   BUN 9 6 - 20 mg/dL    Creatinine, Ser 0.69 (L) 0.76 - 1.27 mg/dL   eGFR 132 >59 mL/min/1.73   BUN/Creatinine Ratio 13 9 - 20   Sodium 143 134 - 144 mmol/L   Potassium 4.2 3.5 - 5.2 mmol/L   Chloride 100 96 - 106 mmol/L   CO2 28 20 - 29 mmol/L   Calcium 9.7 8.7 - 10.2 mg/dL   Total Protein 7.7 6.0 - 8.5 g/dL   Albumin 5.1 4.1 - 5.2 g/dL   Globulin, Total 2.6 1.5 - 4.5 g/dL   Albumin/Globulin Ratio 2.0 1.2 - 2.2   Bilirubin Total 0.3 0.0 - 1.2 mg/dL   Alkaline Phosphatase 79 44 - 121 IU/L   AST 51 (H) 0 - 40 IU/L   ALT 74 (H) 0 - 44 IU/L      Assessment & Plan:   Problem List Items Addressed This Visit       Other   ADD (attention deficit disorder) - Primary    Has been off his medicine for a couple of weeks, but notes that he really needs it. Refills given. Utox done. Recheck 3 months.       Relevant Orders   [id number] 11+Oxyco+Alc+Crt-Bund     Follow up plan: Return in about 3 months (around 11/01/2021) for before 11/01/21 please.

## 2021-08-10 LAB — DRUG SCREEN 764883 11+OXYCO+ALC+CRT-BUND
Amphetamines, Urine: NEGATIVE ng/mL
BENZODIAZ UR QL: NEGATIVE ng/mL
Barbiturate: NEGATIVE ng/mL
Cocaine (Metabolite): NEGATIVE ng/mL
Creatinine: 132.4 mg/dL (ref 20.0–300.0)
Meperidine: NEGATIVE ng/mL
Methadone Screen, Urine: NEGATIVE ng/mL
OPIATE SCREEN URINE: NEGATIVE ng/mL
Oxycodone/Oxymorphone, Urine: NEGATIVE ng/mL
Phencyclidine: NEGATIVE ng/mL
Propoxyphene: NEGATIVE ng/mL
Tramadol: NEGATIVE ng/mL
pH, Urine: 5.6 (ref 4.5–8.9)

## 2021-08-10 LAB — CANNABINOID CONFIRMATION, UR
CANNABINOIDS: POSITIVE — AB
Carboxy THC GC/MS Conf: 132 ng/mL

## 2021-08-10 LAB — PANEL 764016: Ethanol: 0.043 %

## 2021-11-10 ENCOUNTER — Ambulatory Visit (INDEPENDENT_AMBULATORY_CARE_PROVIDER_SITE_OTHER): Payer: Self-pay | Admitting: Family Medicine

## 2021-11-10 ENCOUNTER — Encounter: Payer: Self-pay | Admitting: Family Medicine

## 2021-11-10 DIAGNOSIS — F988 Other specified behavioral and emotional disorders with onset usually occurring in childhood and adolescence: Secondary | ICD-10-CM

## 2021-11-10 MED ORDER — AMPHETAMINE-DEXTROAMPHETAMINE 20 MG PO TABS: 20.0000 mg | ORAL_TABLET | Freq: Two times a day (BID) | ORAL | 0 refills | Status: DC

## 2021-11-10 NOTE — Assessment & Plan Note (Signed)
Under good control on current regimen. Continue current regimen. Continue to monitor. Call with any concerns. Refills given for 3 months. Follow up 3 months.    

## 2021-11-10 NOTE — Progress Notes (Signed)
BP 122/86   Pulse 84   Temp 98.2 F (36.8 C)   Wt 199 lb (90.3 kg)   SpO2 98%   BMI 24.95 kg/m    Subjective:    Patient ID: Kevin Townsend, male    DOB: December 28, 1995, 26 y.o.   MRN: [id number]  HPI: Kevin Townsend is a 26 y.o. male  Chief Complaint  Patient presents with   ADHD   ADHD FOLLOW UP ADHD status: controlled Satisfied with current therapy: yes Medication compliance:  excellent compliance Controlled substance contract: yes Previous psychiatry evaluation: yes Previous medications: yes    Taking meds on weekends/vacations: yes Work/school performance:  excellent Difficulty sustaining attention/completing tasks: no Distracted by extraneous stimuli: no Does not listen when spoken to: no  Fidgets with hands or feet: no Unable to stay in seat: no Blurts out/interrupts others: no ADHD Medication Side Effects: no    Decreased appetite: no    Headache: no    Sleeping disturbance pattern: no    Irritability: no    Rebound effects (worse than baseline) off medication: no    Anxiousness: no    Dizziness: no    Tics: no  Relevant past medical, surgical, family and social history reviewed and updated as indicated. Interim medical history since our last visit reviewed. Allergies and medications reviewed and updated.  Review of Systems  Constitutional: Negative.   Respiratory: Negative.    Cardiovascular: Negative.   Gastrointestinal: Negative.   Musculoskeletal: Negative.   Neurological: Negative.   Psychiatric/Behavioral: Negative.      Per HPI unless specifically indicated above     Objective:    BP 122/86   Pulse 84   Temp 98.2 F (36.8 C)   Wt 199 lb (90.3 kg)   SpO2 98%   BMI 24.95 kg/m   Wt Readings from Last 3 Encounters:  11/10/21 199 lb (90.3 kg)  08/03/21 198 lb 6.4 oz (90 kg)  03/06/21 186 lb 9.6 oz (84.6 kg)    Physical Exam Vitals and nursing note reviewed.  Constitutional:      General: He is not in acute distress.    Appearance:  Normal appearance. He is not ill-appearing, toxic-appearing or diaphoretic.  HENT:     Head: Normocephalic and atraumatic.     Right Ear: External ear normal.     Left Ear: External ear normal.     Nose: Nose normal.     Mouth/Throat:     Mouth: Mucous membranes are moist.     Pharynx: Oropharynx is clear.  Eyes:     General: No scleral icterus.       Right eye: No discharge.        Left eye: No discharge.     Extraocular Movements: Extraocular movements intact.     Conjunctiva/sclera: Conjunctivae normal.     Pupils: Pupils are equal, round, and reactive to light.  Cardiovascular:     Rate and Rhythm: Normal rate and regular rhythm.     Pulses: Normal pulses.     Heart sounds: Normal heart sounds. No murmur heard.    No friction rub. No gallop.  Pulmonary:     Effort: Pulmonary effort is normal. No respiratory distress.     Breath sounds: Normal breath sounds. No stridor. No wheezing, rhonchi or rales.  Chest:     Chest wall: No tenderness.  Musculoskeletal:        General: Normal range of motion.     Cervical back: Normal range of motion and  neck supple.  Skin:    General: Skin is warm and dry.     Capillary Refill: Capillary refill takes less than 2 seconds.     Coloration: Skin is not jaundiced or pale.     Findings: No bruising, erythema, lesion or rash.  Neurological:     General: No focal deficit present.     Mental Status: He is alert and oriented to person, place, and time. Mental status is at baseline.  Psychiatric:        Mood and Affect: Mood normal.        Behavior: Behavior normal.        Thought Content: Thought content normal.        Judgment: Judgment normal.     Results for orders placed or performed in visit on 08/03/21  [id number] 11+Oxyco+Alc+Crt-Bund  Result Value Ref Range   Ethanol See Final Results Cutoff=0.020 %   Amphetamines, Urine Negative Cutoff=1000 ng/mL   Barbiturate Negative Cutoff=200 ng/mL   BENZODIAZ UR QL Negative Cutoff=200 ng/mL    Cannabinoid Quant, Ur See Final Results Cutoff=50 ng/mL   Cocaine (Metabolite) Negative Cutoff=300 ng/mL   OPIATE SCREEN URINE Negative Cutoff=300 ng/mL   Oxycodone/Oxymorphone, Urine Negative Cutoff=300 ng/mL   Phencyclidine Negative Cutoff=25 ng/mL   Methadone Screen, Urine Negative Cutoff=300 ng/mL   Propoxyphene Negative Cutoff=300 ng/mL   Meperidine Negative Cutoff=200 ng/mL   Tramadol Negative Cutoff=200 ng/mL   Creatinine 132.4 20.0 - 300.0 mg/dL   pH, Urine 5.6 4.5 - 8.9  Panel [id number]  Result Value Ref Range   Ethanol 0.043 Cutoff=0.020 %  Cannabinoid Conf, Ur  Result Value Ref Range   CANNABINOIDS Positive (A) Cutoff=50   Carboxy THC GC/MS Conf 132 Cutoff=15 ng/mL      Assessment & Plan:   Problem List Items Addressed This Visit       Other   ADD (attention deficit disorder)    Under good control on current regimen. Continue current regimen. Continue to monitor. Call with any concerns. Refills given for 3 months. Follow up 3 months.          Follow up plan: Return in about 3 months (around 02/08/2022) for before 02/08/22 please.

## 2022-02-15 ENCOUNTER — Ambulatory Visit (INDEPENDENT_AMBULATORY_CARE_PROVIDER_SITE_OTHER): Payer: Self-pay | Admitting: Family Medicine

## 2022-02-15 ENCOUNTER — Encounter: Payer: Self-pay | Admitting: Family Medicine

## 2022-02-15 VITALS — BP 132/90 | HR 101 | Temp 98.4°F | Wt 199.9 lb

## 2022-02-15 DIAGNOSIS — F988 Other specified behavioral and emotional disorders with onset usually occurring in childhood and adolescence: Secondary | ICD-10-CM

## 2022-02-15 MED ORDER — AMPHETAMINE-DEXTROAMPHETAMINE 20 MG PO TABS: 20.0000 mg | ORAL_TABLET | Freq: Two times a day (BID) | ORAL | 0 refills | Status: DC

## 2022-02-15 NOTE — Assessment & Plan Note (Signed)
Under good control on current regimen. Continue current regimen. Continue to monitor. Call with any concerns. Refills given.   

## 2022-02-15 NOTE — Progress Notes (Signed)
BP (!) 132/90   Pulse (!) 101   Temp 98.4 F (36.9 C) (Oral)   Wt 199 lb 14.4 oz (90.7 kg)   SpO2 97%   BMI 25.06 kg/m    Subjective:    Patient ID: Kevin Townsend, male    DOB: Jan 15, 1996, 27 y.o.   MRN: [id number]  HPI: Kevin Townsend is a 27 y.o. male  Chief Complaint  Patient presents with   ADHD   ADHD FOLLOW UP ADHD status: controlled Satisfied with current therapy: yes Medication compliance:  excellent compliance Controlled substance contract: yes Previous psychiatry evaluation: yes Previous medications: yes    Taking meds on weekends/vacations: yes Work/school performance:  excellent Difficulty sustaining attention/completing tasks: no Distracted by extraneous stimuli: no Does not listen when spoken to: no  Fidgets with hands or feet: no Unable to stay in seat: no Blurts out/interrupts others: no ADHD Medication Side Effects: no    Decreased appetite: no    Headache: no    Sleeping disturbance pattern: no    Irritability: no    Rebound effects (worse than baseline) off medication: no    Anxiousness: no    Dizziness: no    Tics: no   Relevant past medical, surgical, family and social history reviewed and updated as indicated. Interim medical history since our last visit reviewed. Allergies and medications reviewed and updated.  Review of Systems  Constitutional: Negative.   Respiratory: Negative.    Cardiovascular: Negative.   Gastrointestinal: Negative.   Musculoskeletal: Negative.   Neurological: Negative.   Psychiatric/Behavioral: Negative.      Per HPI unless specifically indicated above     Objective:    BP (!) 132/90   Pulse (!) 101   Temp 98.4 F (36.9 C) (Oral)   Wt 199 lb 14.4 oz (90.7 kg)   SpO2 97%   BMI 25.06 kg/m   Wt Readings from Last 3 Encounters:  02/15/22 199 lb 14.4 oz (90.7 kg)  11/10/21 199 lb (90.3 kg)  08/03/21 198 lb 6.4 oz (90 kg)    Physical Exam Vitals and nursing note reviewed.  Constitutional:       General: He is not in acute distress.    Appearance: Normal appearance. He is not ill-appearing, toxic-appearing or diaphoretic.  HENT:     Head: Normocephalic and atraumatic.     Right Ear: External ear normal.     Left Ear: External ear normal.     Nose: Nose normal.     Mouth/Throat:     Mouth: Mucous membranes are moist.     Pharynx: Oropharynx is clear.  Eyes:     General: No scleral icterus.       Right eye: No discharge.        Left eye: No discharge.     Extraocular Movements: Extraocular movements intact.     Conjunctiva/sclera: Conjunctivae normal.     Pupils: Pupils are equal, round, and reactive to light.  Cardiovascular:     Rate and Rhythm: Normal rate and regular rhythm.     Pulses: Normal pulses.     Heart sounds: Normal heart sounds. No murmur heard.    No friction rub. No gallop.  Pulmonary:     Effort: Pulmonary effort is normal. No respiratory distress.     Breath sounds: Normal breath sounds. No stridor. No wheezing, rhonchi or rales.  Chest:     Chest wall: No tenderness.  Musculoskeletal:        General: Normal range of motion.  Cervical back: Normal range of motion and neck supple.  Skin:    General: Skin is warm and dry.     Capillary Refill: Capillary refill takes less than 2 seconds.     Coloration: Skin is not jaundiced or pale.     Findings: No bruising, erythema, lesion or rash.  Neurological:     General: No focal deficit present.     Mental Status: He is alert and oriented to person, place, and time. Mental status is at baseline.  Psychiatric:        Mood and Affect: Mood normal.        Behavior: Behavior normal.        Thought Content: Thought content normal.        Judgment: Judgment normal.     Results for orders placed or performed in visit on 08/03/21  [id number] 11+Oxyco+Alc+Crt-Bund  Result Value Ref Range   Ethanol See Final Results Cutoff=0.020 %   Amphetamines, Urine Negative Cutoff=1000 ng/mL   Barbiturate Negative  Cutoff=200 ng/mL   BENZODIAZ UR QL Negative Cutoff=200 ng/mL   Cannabinoid Quant, Ur See Final Results Cutoff=50 ng/mL   Cocaine (Metabolite) Negative Cutoff=300 ng/mL   OPIATE SCREEN URINE Negative Cutoff=300 ng/mL   Oxycodone/Oxymorphone, Urine Negative Cutoff=300 ng/mL   Phencyclidine Negative Cutoff=25 ng/mL   Methadone Screen, Urine Negative Cutoff=300 ng/mL   Propoxyphene Negative Cutoff=300 ng/mL   Meperidine Negative Cutoff=200 ng/mL   Tramadol Negative Cutoff=200 ng/mL   Creatinine 132.4 20.0 - 300.0 mg/dL   pH, Urine 5.6 4.5 - 8.9  Panel [id number]  Result Value Ref Range   Ethanol 0.043 Cutoff=0.020 %  Cannabinoid Conf, Ur  Result Value Ref Range   CANNABINOIDS Positive (A) Cutoff=50   Carboxy THC GC/MS Conf 132 Cutoff=15 ng/mL      Assessment & Plan:   Problem List Items Addressed This Visit       Other   ADD (attention deficit disorder) - Primary    Under good control on current regimen. Continue current regimen. Continue to monitor. Call with any concerns. Refills given.          Follow up plan: Return in about 3 months (around 05/17/2022).

## 2022-05-21 ENCOUNTER — Ambulatory Visit: Payer: Self-pay | Admitting: Family Medicine

## 2022-05-24 ENCOUNTER — Encounter: Payer: Self-pay | Admitting: Family Medicine

## 2022-05-24 ENCOUNTER — Ambulatory Visit (INDEPENDENT_AMBULATORY_CARE_PROVIDER_SITE_OTHER): Payer: Self-pay | Admitting: Family Medicine

## 2022-05-24 VITALS — BP 122/86 | HR 83 | Temp 98.5°F | Ht 76.0 in | Wt 195.6 lb

## 2022-05-24 DIAGNOSIS — F988 Other specified behavioral and emotional disorders with onset usually occurring in childhood and adolescence: Secondary | ICD-10-CM

## 2022-05-24 MED ORDER — AMPHETAMINE-DEXTROAMPHETAMINE 20 MG PO TABS: 20.0000 mg | ORAL_TABLET | Freq: Two times a day (BID) | ORAL | 0 refills | Status: DC

## 2022-05-24 NOTE — Assessment & Plan Note (Signed)
Under good control on current regimen. Continue current regimen. Continue to monitor. Call with any concerns. Refills given for 3 months. Follow up 3 months.    

## 2022-05-24 NOTE — Progress Notes (Signed)
BP 122/86   Pulse 83   Temp 98.5 F (36.9 C)   Ht  (1.93 m)   Wt 195 lb 9.6 oz (88.7 kg)   SpO2 98%   BMI 23.81 kg/m    Subjective:    Patient ID: Kevin Townsend, male    DOB: 1995/09/16, 27 y.o.   MRN: [id number]  HPI: Kevin Townsend is a 27 y.o. male  Chief Complaint  Patient presents with   ADHD   ADHD FOLLOW UP ADHD status: controlled Satisfied with current therapy: yes Medication compliance:  excellent compliance Controlled substance contract: no Previous psychiatry evaluation: yes Previous medications: yes    Taking meds on weekends/vacations: yes Work/school performance:  good Difficulty sustaining attention/completing tasks: no Distracted by extraneous stimuli: no Does not listen when spoken to: no  Fidgets with hands or feet: no Unable to stay in seat: no Blurts out/interrupts others: no ADHD Medication Side Effects: no    Decreased appetite: no    Headache: no    Sleeping disturbance pattern: no    Irritability: no    Rebound effects (worse than baseline) off medication: no    Anxiousness: no    Dizziness: no    Tics: no  Relevant past medical, surgical, family and social history reviewed and updated as indicated. Interim medical history since our last visit reviewed. Allergies and medications reviewed and updated.  Review of Systems  Constitutional: Negative.   Respiratory: Negative.    Cardiovascular: Negative.   Gastrointestinal: Negative.   Musculoskeletal: Negative.   Psychiatric/Behavioral: Negative.      Per HPI unless specifically indicated above     Objective:    BP 122/86   Pulse 83   Temp 98.5 F (36.9 C)   Ht  (1.93 m)   Wt 195 lb 9.6 oz (88.7 kg)   SpO2 98%   BMI 23.81 kg/m   Wt Readings from Last 3 Encounters:  05/24/22 195 lb 9.6 oz (88.7 kg)  02/15/22 199 lb 14.4 oz (90.7 kg)  11/10/21 199 lb (90.3 kg)    Physical Exam Vitals and nursing note reviewed.  Constitutional:      General: He is not in acute  distress.    Appearance: Normal appearance. He is not ill-appearing, toxic-appearing or diaphoretic.  HENT:     Head: Normocephalic and atraumatic.     Right Ear: External ear normal.     Left Ear: External ear normal.     Nose: Nose normal.     Mouth/Throat:     Mouth: Mucous membranes are moist.     Pharynx: Oropharynx is clear.  Eyes:     General: No scleral icterus.       Right eye: No discharge.        Left eye: No discharge.     Extraocular Movements: Extraocular movements intact.     Conjunctiva/sclera: Conjunctivae normal.     Pupils: Pupils are equal, round, and reactive to light.  Cardiovascular:     Rate and Rhythm: Normal rate and regular rhythm.     Pulses: Normal pulses.     Heart sounds: Normal heart sounds. No murmur heard.    No friction rub. No gallop.  Pulmonary:     Effort: Pulmonary effort is normal. No respiratory distress.     Breath sounds: Normal breath sounds. No stridor. No wheezing, rhonchi or rales.  Chest:     Chest wall: No tenderness.  Musculoskeletal:        General: Normal  range of motion.     Cervical back: Normal range of motion and neck supple.  Skin:    General: Skin is warm and dry.     Capillary Refill: Capillary refill takes less than 2 seconds.     Coloration: Skin is not jaundiced or pale.     Findings: No bruising, erythema, lesion or rash.  Neurological:     General: No focal deficit present.     Mental Status: He is alert and oriented to person, place, and time. Mental status is at baseline.  Psychiatric:        Mood and Affect: Mood normal.        Behavior: Behavior normal.        Thought Content: Thought content normal.        Judgment: Judgment normal.     Results for orders placed or performed in visit on 08/03/21  [id number] 11+Oxyco+Alc+Crt-Bund  Result Value Ref Range   Ethanol See Final Results Cutoff=0.020 %   Amphetamines, Urine Negative Cutoff=1000 ng/mL   Barbiturate Negative Cutoff=200 ng/mL   BENZODIAZ UR QL  Negative Cutoff=200 ng/mL   Cannabinoid Quant, Ur See Final Results Cutoff=50 ng/mL   Cocaine (Metabolite) Negative Cutoff=300 ng/mL   OPIATE SCREEN URINE Negative Cutoff=300 ng/mL   Oxycodone/Oxymorphone, Urine Negative Cutoff=300 ng/mL   Phencyclidine Negative Cutoff=25 ng/mL   Methadone Screen, Urine Negative Cutoff=300 ng/mL   Propoxyphene Negative Cutoff=300 ng/mL   Meperidine Negative Cutoff=200 ng/mL   Tramadol Negative Cutoff=200 ng/mL   Creatinine 132.4 20.0 - 300.0 mg/dL   pH, Urine 5.6 4.5 - 8.9  Panel [id number]  Result Value Ref Range   Ethanol 0.043 Cutoff=0.020 %  Cannabinoid Conf, Ur  Result Value Ref Range   CANNABINOIDS Positive (A) Cutoff=50   Carboxy THC GC/MS Conf 132 Cutoff=15 ng/mL      Assessment & Plan:   Problem List Items Addressed This Visit       Other   ADD (attention deficit disorder) - Primary    Under good control on current regimen. Continue current regimen. Continue to monitor. Call with any concerns. Refills given for 3 months. Follow up 3 months.         Follow up plan: Return in about 3 months (around 08/23/2022).

## 2022-09-21 ENCOUNTER — Ambulatory Visit (INDEPENDENT_AMBULATORY_CARE_PROVIDER_SITE_OTHER): Payer: BC Managed Care – PPO | Admitting: Family Medicine

## 2022-09-21 ENCOUNTER — Encounter: Payer: Self-pay | Admitting: Family Medicine

## 2022-09-21 VITALS — BP 134/84 | HR 81 | Temp 98.0°F | Wt 199.4 lb

## 2022-09-21 DIAGNOSIS — F988 Other specified behavioral and emotional disorders with onset usually occurring in childhood and adolescence: Secondary | ICD-10-CM

## 2022-09-21 MED ORDER — AMPHETAMINE-DEXTROAMPHETAMINE 20 MG PO TABS: 20.0000 mg | ORAL_TABLET | Freq: Two times a day (BID) | ORAL | 0 refills | Status: DC

## 2022-09-21 NOTE — Assessment & Plan Note (Signed)
Under good control on current regimen. Continue current regimen. Continue to monitor. Call with any concerns. Refills given for 3 months. Follow up 3 months.    

## 2022-09-21 NOTE — Progress Notes (Signed)
BP 134/84   Pulse 81   Temp 98 F (36.7 C) (Oral)   Wt 199 lb 6.4 oz (90.4 kg)   SpO2 99%   BMI 24.27 kg/m    Subjective:    Patient ID: Kevin Townsend, male    DOB: [birth year], 27 y.o.   MRN: [id number]  HPI: Kevin Townsend is a 27 y.o. male  Chief Complaint  Patient presents with   ADHD        ADHD FOLLOW UP ADHD status: controlled Satisfied with current therapy: yes Medication compliance:  excellent compliance Controlled substance contract: yes Previous psychiatry evaluation: yes Previous medications: yes    Taking meds on weekends/vacations: occasionally Work/school performance:  excellent Difficulty sustaining attention/completing tasks: no Distracted by extraneous stimuli: no Does not listen when spoken to: no  Fidgets with hands or feet: no Unable to stay in seat: no Blurts out/interrupts others: no ADHD Medication Side Effects: no    Decreased appetite: no    Headache: no    Sleeping disturbance pattern: no    Irritability: no    Rebound effects (worse than baseline) off medication: no    Anxiousness: no    Dizziness: no    Tics: no   Relevant past medical, surgical, family and social history reviewed and updated as indicated. Interim medical history since our last visit reviewed. Allergies and medications reviewed and updated.  Review of Systems  Constitutional: Negative.   Respiratory: Negative.    Cardiovascular: Negative.   Gastrointestinal: Negative.   Musculoskeletal: Negative.   Neurological: Negative.   Psychiatric/Behavioral: Negative.      Per HPI unless specifically indicated above     Objective:    BP 134/84   Pulse 81   Temp 98 F (36.7 C) (Oral)   Wt 199 lb 6.4 oz (90.4 kg)   SpO2 99%   BMI 24.27 kg/m   Wt Readings from Last 3 Encounters:  09/21/22 199 lb 6.4 oz (90.4 kg)  05/24/22 195 lb 9.6 oz (88.7 kg)  02/15/22 199 lb 14.4 oz (90.7 kg)    Physical Exam Vitals and nursing note reviewed.  Constitutional:       General: He is not in acute distress.    Appearance: Normal appearance. He is normal weight. He is not ill-appearing, toxic-appearing or diaphoretic.  HENT:     Head: Normocephalic and atraumatic.     Right Ear: External ear normal.     Left Ear: External ear normal.     Nose: Nose normal.     Mouth/Throat:     Mouth: Mucous membranes are moist.     Pharynx: Oropharynx is clear.  Eyes:     General: No scleral icterus.       Right eye: No discharge.        Left eye: No discharge.     Extraocular Movements: Extraocular movements intact.     Conjunctiva/sclera: Conjunctivae normal.     Pupils: Pupils are equal, round, and reactive to light.  Cardiovascular:     Rate and Rhythm: Normal rate and regular rhythm.     Pulses: Normal pulses.     Heart sounds: Normal heart sounds. No murmur heard.    No friction rub. No gallop.  Pulmonary:     Effort: Pulmonary effort is normal. No respiratory distress.     Breath sounds: Normal breath sounds. No stridor. No wheezing, rhonchi or rales.  Chest:     Chest wall: No tenderness.  Musculoskeletal:  General: Normal range of motion.     Cervical back: Normal range of motion and neck supple.  Skin:    General: Skin is warm and dry.     Capillary Refill: Capillary refill takes less than 2 seconds.     Coloration: Skin is not jaundiced or pale.     Findings: No bruising, erythema, lesion or rash.  Neurological:     General: No focal deficit present.     Mental Status: He is alert and oriented to person, place, and time. Mental status is at baseline.  Psychiatric:        Mood and Affect: Mood normal.        Behavior: Behavior normal.        Thought Content: Thought content normal.        Judgment: Judgment normal.     Results for orders placed or performed in visit on 08/03/21  [id number] 11+Oxyco+Alc+Crt-Bund  Result Value Ref Range   Ethanol See Final Results Cutoff=0.020 %   Amphetamines, Urine Negative Cutoff=1000 ng/mL    Barbiturate Negative Cutoff=200 ng/mL   BENZODIAZ UR QL Negative Cutoff=200 ng/mL   Cannabinoid Quant, Ur See Final Results Cutoff=50 ng/mL   Cocaine (Metabolite) Negative Cutoff=300 ng/mL   OPIATE SCREEN URINE Negative Cutoff=300 ng/mL   Oxycodone/Oxymorphone, Urine Negative Cutoff=300 ng/mL   Phencyclidine Negative Cutoff=25 ng/mL   Methadone Screen, Urine Negative Cutoff=300 ng/mL   Propoxyphene Negative Cutoff=300 ng/mL   Meperidine Negative Cutoff=200 ng/mL   Tramadol Negative Cutoff=200 ng/mL   Creatinine 132.4 20.0 - 300.0 mg/dL   pH, Urine 5.6 4.5 - 8.9  Panel [id number]  Result Value Ref Range   Ethanol 0.043 Cutoff=0.020 %  Cannabinoid Conf, Ur  Result Value Ref Range   CANNABINOIDS Positive (A) Cutoff=50   Carboxy THC GC/MS Conf 132 Cutoff=15 ng/mL      Assessment & Plan:   Problem List Items Addressed This Visit       Other   ADD (attention deficit disorder) - Primary    Under good control on current regimen. Continue current regimen. Continue to monitor. Call with any concerns. Refills given for 3 months. Follow up 3 months.          Follow up plan: Return in about 3 months (around 12/22/2022) for physical/follow up.

## 2023-02-22 ENCOUNTER — Ambulatory Visit: Payer: Self-pay | Admitting: Family Medicine

## 2023-03-19 ENCOUNTER — Encounter: Payer: Self-pay | Admitting: Family Medicine

## 2023-03-19 ENCOUNTER — Ambulatory Visit (INDEPENDENT_AMBULATORY_CARE_PROVIDER_SITE_OTHER): Payer: Self-pay | Admitting: Family Medicine

## 2023-03-19 VITALS — BP 112/73 | HR 98 | Temp 98.7°F | Resp 17 | Ht 75.98 in | Wt 212.2 lb

## 2023-03-19 DIAGNOSIS — F988 Other specified behavioral and emotional disorders with onset usually occurring in childhood and adolescence: Secondary | ICD-10-CM

## 2023-03-19 MED ORDER — AMPHETAMINE-DEXTROAMPHETAMINE 20 MG PO TABS: 20.0000 mg | ORAL_TABLET | Freq: Two times a day (BID) | ORAL | 0 refills | Status: DC

## 2023-03-19 NOTE — Progress Notes (Signed)
BP 112/73 (BP Location: Left Arm, Patient Position: Sitting, Cuff Size: Large)   Pulse 98   Temp 98.7 F (37.1 C) (Oral)   Resp 17   Ht 6' 3.98" (1.93 m)   Wt 212 lb 3.2 oz (96.3 kg)   SpO2 99%   BMI 25.84 kg/m    Subjective:    Patient ID: Kevin Townsend, male    DOB: 05-20-1995, 28 y.o.   MRN: [id number]  HPI: Kevin Townsend is a 28 y.o. male  Chief Complaint  Patient presents with   ADHD    Going well    ADHD FOLLOW UP- was out of town working for several months so was off his meds. Does better when he's on them ADHD status: uncontrolled Satisfied with current therapy: yes Medication compliance:  fair compliance Controlled substance contract: yes Previous psychiatry evaluation: yes Previous medications: yes    Taking meds on weekends/vacations: no Work/school performance:  good Difficulty sustaining attention/completing tasks: yes Distracted by extraneous stimuli: yes Does not listen when spoken to: no  Fidgets with hands or feet: no Unable to stay in seat: no Blurts out/interrupts others: no ADHD Medication Side Effects: no    Decreased appetite: no    Headache: no    Sleeping disturbance pattern: no    Irritability: no    Rebound effects (worse than baseline) off medication: no    Anxiousness: no    Dizziness: no    Tics: no  Relevant past medical, surgical, family and social history reviewed and updated as indicated. Interim medical history since our last visit reviewed. Allergies and medications reviewed and updated.  Review of Systems  Constitutional: Negative.   Respiratory: Negative.    Cardiovascular: Negative.   Musculoskeletal: Negative.   Psychiatric/Behavioral: Negative.      Per HPI unless specifically indicated above     Objective:    BP 112/73 (BP Location: Left Arm, Patient Position: Sitting, Cuff Size: Large)   Pulse 98   Temp 98.7 F (37.1 C) (Oral)   Resp 17   Ht 6' 3.98" (1.93 m)   Wt 212 lb 3.2 oz (96.3 kg)   SpO2 99%    BMI 25.84 kg/m   Wt Readings from Last 3 Encounters:  03/19/23 212 lb 3.2 oz (96.3 kg)  09/21/22 199 lb 6.4 oz (90.4 kg)  05/24/22 195 lb 9.6 oz (88.7 kg)    Physical Exam Vitals and nursing note reviewed.  Constitutional:      General: He is not in acute distress.    Appearance: Normal appearance. He is not ill-appearing, toxic-appearing or diaphoretic.  HENT:     Head: Normocephalic and atraumatic.     Right Ear: External ear normal.     Left Ear: External ear normal.     Nose: Nose normal.     Mouth/Throat:     Mouth: Mucous membranes are moist.     Pharynx: Oropharynx is clear.  Eyes:     General: No scleral icterus.       Right eye: No discharge.        Left eye: No discharge.     Extraocular Movements: Extraocular movements intact.     Conjunctiva/sclera: Conjunctivae normal.     Pupils: Pupils are equal, round, and reactive to light.  Cardiovascular:     Rate and Rhythm: Normal rate and regular rhythm.     Pulses: Normal pulses.     Heart sounds: Normal heart sounds. No murmur heard.    No friction rub.  No gallop.  Pulmonary:     Effort: Pulmonary effort is normal. No respiratory distress.     Breath sounds: Normal breath sounds. No stridor. No wheezing, rhonchi or rales.  Chest:     Chest wall: No tenderness.  Musculoskeletal:        General: Normal range of motion.     Cervical back: Normal range of motion and neck supple.  Skin:    General: Skin is warm and dry.     Capillary Refill: Capillary refill takes less than 2 seconds.     Coloration: Skin is not jaundiced or pale.     Findings: No bruising, erythema, lesion or rash.  Neurological:     General: No focal deficit present.     Mental Status: He is alert and oriented to person, place, and time. Mental status is at baseline.  Psychiatric:        Mood and Affect: Mood normal.        Behavior: Behavior normal.        Thought Content: Thought content normal.        Judgment: Judgment normal.      Results for orders placed or performed in visit on 08/03/21  [id number] 11+Oxyco+Alc+Crt-Bund   Collection Time: 08/03/21  9:51 AM  Result Value Ref Range   Ethanol See Final Results Cutoff=0.020 %   Amphetamines, Urine Negative Cutoff=1000 ng/mL   Barbiturate Negative Cutoff=200 ng/mL   BENZODIAZ UR QL Negative Cutoff=200 ng/mL   Cannabinoid Quant, Ur See Final Results Cutoff=50 ng/mL   Cocaine (Metabolite) Negative Cutoff=300 ng/mL   OPIATE SCREEN URINE Negative Cutoff=300 ng/mL   Oxycodone/Oxymorphone, Urine Negative Cutoff=300 ng/mL   Phencyclidine Negative Cutoff=25 ng/mL   Methadone Screen, Urine Negative Cutoff=300 ng/mL   Propoxyphene Negative Cutoff=300 ng/mL   Meperidine Negative Cutoff=200 ng/mL   Tramadol Negative Cutoff=200 ng/mL   Creatinine 132.4 20.0 - 300.0 mg/dL   pH, Urine 5.6 4.5 - 8.9  Panel [id number]   Collection Time: 08/03/21  9:51 AM  Result Value Ref Range   Ethanol 0.043 Cutoff=0.020 %  Cannabinoid Conf, Ur   Collection Time: 08/03/21  9:51 AM  Result Value Ref Range   CANNABINOIDS Positive (A) Cutoff=50   Carboxy THC GC/MS Conf 132 Cutoff=15 ng/mL      Assessment & Plan:   Problem List Items Addressed This Visit       Other   ADD (attention deficit disorder) - Primary   Under good control on current regimen. Continue current regimen. Continue to monitor. Call with any concerns. Refills given for 3 months. Follow up 3 months.          Follow up plan: Return in about 3 months (around 06/16/2023) for physical.

## 2023-03-19 NOTE — Assessment & Plan Note (Signed)
Under good control on current regimen. Continue current regimen. Continue to monitor. Call with any concerns. Refills given for 3 months. Follow up 3 months.

## 2023-10-23 DIAGNOSIS — L718 Other rosacea: Secondary | ICD-10-CM | POA: Diagnosis not present

## 2023-11-12 ENCOUNTER — Encounter: Payer: Self-pay | Admitting: Family Medicine

## 2023-11-12 ENCOUNTER — Ambulatory Visit (INDEPENDENT_AMBULATORY_CARE_PROVIDER_SITE_OTHER): Payer: Self-pay | Admitting: Family Medicine

## 2023-11-12 VITALS — BP 121/83 | HR 99 | Temp 97.9°F | Ht 76.0 in | Wt 208.1 lb

## 2023-11-12 DIAGNOSIS — F988 Other specified behavioral and emotional disorders with onset usually occurring in childhood and adolescence: Secondary | ICD-10-CM

## 2023-11-12 MED ORDER — AMPHETAMINE-DEXTROAMPHETAMINE 20 MG PO TABS: 20.0000 mg | ORAL_TABLET | Freq: Two times a day (BID) | ORAL | 0 refills | Status: AC

## 2023-11-12 NOTE — Progress Notes (Signed)
 BP 121/83   Pulse 99   Temp 97.9 F (36.6 C) (Oral)   Ht 6' 4 (1.93 m)   Wt 208 lb 2 oz (94.4 kg)   SpO2 98%   BMI 25.33 kg/m    Subjective:    Patient ID: Kevin Townsend, male    DOB: [birth year], 28 y.o.   MRN: [id number]  HPI: Kevin Townsend is a 28 y.o. male  Chief Complaint  Patient presents with   ADHD   ADHD FOLLOW UP- stopped his medicine for a while then felt like he needed it again so restarted it. Did well while on it ADHD status: stable Satisfied with current therapy: yes Medication compliance:  fair compliance Controlled substance contract: yes Previous psychiatry evaluation: yes Previous medications: yes    Taking meds on weekends/vacations: occasionally Work/school performance:  good Difficulty sustaining attention/completing tasks: no Distracted by extraneous stimuli: no Does not listen when spoken to: no  Fidgets with hands or feet: no Unable to stay in seat: no Blurts out/interrupts others: no ADHD Medication Side Effects: no    Decreased appetite: no    Headache: no    Sleeping disturbance pattern: no    Irritability: no    Rebound effects (worse than baseline) off medication: no    Anxiousness: no    Dizziness: no    Tics: no  Relevant past medical, surgical, family and social history reviewed and updated as indicated. Interim medical history since our last visit reviewed. Allergies and medications reviewed and updated.  Review of Systems  Constitutional: Negative.   Respiratory: Negative.    Cardiovascular: Negative.   Musculoskeletal: Negative.   Neurological: Negative.   Psychiatric/Behavioral: Negative.      Per HPI unless specifically indicated above     Objective:    BP 121/83   Pulse 99   Temp 97.9 F (36.6 C) (Oral)   Ht 6' 4 (1.93 m)   Wt 208 lb 2 oz (94.4 kg)   SpO2 98%   BMI 25.33 kg/m   Wt Readings from Last 3 Encounters:  11/12/23 208 lb 2 oz (94.4 kg)  03/19/23 212 lb 3.2 oz (96.3 kg)  09/21/22 199 lb 6.4 oz  (90.4 kg)    Physical Exam Vitals and nursing note reviewed.  Constitutional:      General: He is not in acute distress.    Appearance: Normal appearance. He is not ill-appearing, toxic-appearing or diaphoretic.  HENT:     Head: Normocephalic and atraumatic.     Right Ear: External ear normal.     Left Ear: External ear normal.     Nose: Nose normal.     Mouth/Throat:     Mouth: Mucous membranes are moist.     Pharynx: Oropharynx is clear.  Eyes:     General: No scleral icterus.       Right eye: No discharge.        Left eye: No discharge.     Extraocular Movements: Extraocular movements intact.     Conjunctiva/sclera: Conjunctivae normal.     Pupils: Pupils are equal, round, and reactive to light.  Cardiovascular:     Rate and Rhythm: Normal rate and regular rhythm.     Pulses: Normal pulses.     Heart sounds: Normal heart sounds. No murmur heard.    No friction rub. No gallop.  Pulmonary:     Effort: Pulmonary effort is normal. No respiratory distress.     Breath sounds: Normal breath sounds. No stridor. No  wheezing, rhonchi or rales.  Chest:     Chest wall: No tenderness.  Musculoskeletal:        General: Normal range of motion.     Cervical back: Normal range of motion and neck supple.  Skin:    General: Skin is warm and dry.     Capillary Refill: Capillary refill takes less than 2 seconds.     Coloration: Skin is not jaundiced or pale.     Findings: No bruising, erythema, lesion or rash.  Neurological:     General: No focal deficit present.     Mental Status: He is alert and oriented to person, place, and time. Mental status is at baseline.  Psychiatric:        Mood and Affect: Mood normal.        Behavior: Behavior normal.        Thought Content: Thought content normal.        Judgment: Judgment normal.     Results for orders placed or performed in visit on 08/03/21  [id number] 11+Oxyco+Alc+Crt-Bund   Collection Time: 08/03/21  9:51 AM  Result Value Ref Range    Ethanol See Final Results Cutoff=0.020 %   Amphetamines, Urine Negative Cutoff=1000 ng/mL   Barbiturate Negative Cutoff=200 ng/mL   BENZODIAZ UR QL Negative Cutoff=200 ng/mL   Cannabinoid Quant, Ur See Final Results Cutoff=50 ng/mL   Cocaine (Metabolite) Negative Cutoff=300 ng/mL   OPIATE SCREEN URINE Negative Cutoff=300 ng/mL   Oxycodone/Oxymorphone, Urine Negative Cutoff=300 ng/mL   Phencyclidine Negative Cutoff=25 ng/mL   Methadone Screen, Urine Negative Cutoff=300 ng/mL   Propoxyphene Negative Cutoff=300 ng/mL   Meperidine Negative Cutoff=200 ng/mL   Tramadol Negative Cutoff=200 ng/mL   Creatinine 132.4 20.0 - 300.0 mg/dL   pH, Urine 5.6 4.5 - 8.9  Panel [id number]   Collection Time: 08/03/21  9:51 AM  Result Value Ref Range   Ethanol 0.043 Cutoff=0.020 %  Cannabinoid Conf, Ur   Collection Time: 08/03/21  9:51 AM  Result Value Ref Range   CANNABINOIDS Positive (A) Cutoff=50   Carboxy THC GC/MS Conf 132 Cutoff=15 ng/mL      Assessment & Plan:   Problem List Items Addressed This Visit       Other   ADD (attention deficit disorder) - Primary   Under good control on current regimen. Continue current regimen. Continue to monitor. Call with any concerns. Refills given for 3 months. Follow up 3 months.          Follow up plan: Return in about 3 months (around 02/12/2024) for physical.

## 2023-11-12 NOTE — Assessment & Plan Note (Signed)
 Under good control on current regimen. Continue current regimen. Continue to monitor. Call with any concerns. Refills given for 3 months. Follow up 3 months.
